# Patient Record
Sex: Male | Born: 1945
Health system: Southern US, Community
[De-identification: ages and names within clinical notes are randomized; demographics above are authoritative.]

## PROBLEM LIST (undated history)

## (undated) DIAGNOSIS — E785 Hyperlipidemia, unspecified: Secondary | ICD-10-CM

## (undated) DIAGNOSIS — I10 Essential (primary) hypertension: Secondary | ICD-10-CM

## (undated) DIAGNOSIS — E119 Type 2 diabetes mellitus without complications: Secondary | ICD-10-CM

## (undated) DIAGNOSIS — Z8249 Family history of ischemic heart disease and other diseases of the circulatory system: Secondary | ICD-10-CM

## (undated) DIAGNOSIS — E669 Obesity, unspecified: Secondary | ICD-10-CM

## (undated) HISTORY — DX: Family history of ischemic heart disease and other diseases of the circulatory system: Z82.49

## (undated) HISTORY — DX: Hyperlipidemia, unspecified: E78.5

## (undated) HISTORY — DX: Obesity, unspecified: E66.9

## (undated) HISTORY — DX: Type 2 diabetes mellitus without complications: E11.9

## (undated) HISTORY — DX: Essential (primary) hypertension: I10

---

## 1999-07-24 HISTORY — PX: DOBUTAMINE STRESS ECHO: SHX5426

## 2001-12-23 HISTORY — PX: NM MYOVIEW LTD: HXRAD82

## 2009-03-09 HISTORY — PX: OTHER SURGICAL HISTORY: SHX169

## 2014-02-22 ENCOUNTER — Ambulatory Visit (INDEPENDENT_AMBULATORY_CARE_PROVIDER_SITE_OTHER): Payer: Medicare HMO | Admitting: Cardiovascular Disease

## 2014-02-22 ENCOUNTER — Encounter: Payer: Self-pay | Admitting: Cardiovascular Disease

## 2014-02-22 DIAGNOSIS — E785 Hyperlipidemia, unspecified: Secondary | ICD-10-CM

## 2014-02-22 DIAGNOSIS — R0609 Other forms of dyspnea: Secondary | ICD-10-CM

## 2014-02-22 DIAGNOSIS — R0600 Dyspnea, unspecified: Secondary | ICD-10-CM

## 2014-02-22 DIAGNOSIS — E119 Type 2 diabetes mellitus without complications: Secondary | ICD-10-CM | POA: Insufficient documentation

## 2014-02-22 DIAGNOSIS — I10 Essential (primary) hypertension: Secondary | ICD-10-CM | POA: Insufficient documentation

## 2014-02-22 DIAGNOSIS — R5383 Other fatigue: Secondary | ICD-10-CM

## 2014-02-22 NOTE — Assessment & Plan Note (Signed)
History of hypertension with blood pressure measured today at 144/78. He is on lisinopril at 45 side. Continue current meds at current dosing

## 2014-02-22 NOTE — Assessment & Plan Note (Signed)
History of hyperlipidemia on Zetia and Crestor with recent blood work performed 02/10/14 by his PCP revealing a total cholesterol 125, LDL 59 HDL of 45.

## 2014-02-22 NOTE — Assessment & Plan Note (Signed)
He complains of dyspnea and fatigue. He does have multiple cardiac risk factors including type 2 diabetes, treated hypertension, hyperlipidemia and family history. I'm going to get an exercise Myoview stress test and a 2-D echocardiogram to evaluate.

## 2014-02-22 NOTE — Patient Instructions (Signed)
  We will see you back in follow up after the tests.   Dr Allyson SabalBerry has ordered: 1. Exercise Myoview- this is a test that looks at the blood flow to your heart muscle.  It takes approximately 2 1/2 hours. Please follow instruction sheet, as given.  2.  Echocardiogram. Echocardiography is a painless test that uses sound waves to create images of your heart. It provides your doctor with information about the size and shape of your heart and how well your heart's chambers and valves are working. This procedure takes approximately one hour. There are no restrictions for this procedure.

## 2014-02-22 NOTE — Progress Notes (Signed)
02/22/2014 Alvin Davis   Jan 17, 1946  [id number]  Primary Physician No primary care provider on file. Primary Cardiologist: Runell Gess MD Roseanne Reno   HPI:  Alvin Davis is a 69 year old mildly overweight married Caucasian male father of 2 children who is retired Nurse, learning disability. He was referred by Dr. Lorin Davis for cardiovascular evaluation because of increasing fatigue and dyspnea. His cardiac risk factor profile is notable for family history with a brother who had bypass grafting at age 66 Alvin Davis , patient of mine). He has treated To diabetes, hypertension and hyperlipidemia. He is never had a heart attack or stroke. He is complaining of increasing fatigue and dyspnea recently.   Current Outpatient Prescriptions  Medication Sig Dispense Refill  . BYDUREON 2 MG SUSR every 7 (seven) days.    . CRESTOR 20 MG tablet 20 mg daily.    . fluticasone (FLONASE) 50 MCG/ACT nasal spray Frequency:as needed   Dosage:50   MCG/ACT  Instructions:Fluticasone Propionate (50MCG/ACT SUSP, Nasal as needed)  Note:    . gabapentin (NEURONTIN) 300 MG capsule Take 300 mg by mouth at bedtime.    Marland Kitchen levothyroxine (SYNTHROID, LEVOTHROID) 125 MCG tablet 125 mcg daily.    Marland Kitchen lisinopril (PRINIVIL,ZESTRIL) 20 MG tablet 20 mg every other day. Alternates with lisinopril hct every other day    . lisinopril-hydrochlorothiazide (PRINZIDE,ZESTORETIC) 20-25 MG per tablet every other day. Alternates qod with plain lisinopril    . metFORMIN (GLUCOPHAGE) 1000 MG tablet 1,000 mg 2 (two) times daily.    . niacin (NIASPAN) 500 MG CR tablet 500 mg daily.    Marland Kitchen omeprazole (PRILOSEC) 40 MG capsule 40 mg as needed.    . pioglitazone (ACTOS) 45 MG tablet 45 mg daily.    Marland Kitchen ZETIA 10 MG tablet 10 mg daily.     No current facility-administered medications for this visit.    No Known Allergies  History   Social History  . Marital Status: Married    Spouse Name: N/A    Number of Children: N/A  . Years of  Education: N/A   Occupational History  . Not on file.   Social History Main Topics  . Smoking status: Never Smoker   . Smokeless tobacco: Not on file  . Alcohol Use: Not on file  . Drug Use: Not on file  . Sexual Activity: Not on file   Other Topics Concern  . Not on file   Social History Narrative  . No narrative on file     Review of Systems: General: negative for chills, fever, night sweats or weight changes.  Cardiovascular: negative for chest pain, dyspnea on exertion, edema, orthopnea, palpitations, paroxysmal nocturnal dyspnea or shortness of breath Dermatological: negative for rash Respiratory: negative for cough or wheezing Urologic: negative for hematuria Abdominal: negative for nausea, vomiting, diarrhea, bright red blood per rectum, melena, or hematemesis Neurologic: negative for visual changes, syncope, or dizziness All other systems reviewed and are otherwise negative except as noted above.    Blood pressure 144/78, pulse 79, height 5' 11.5" (1.816 m), weight 245 lb (111.131 kg).  General appearance: alert and no distress Neck: no adenopathy, no carotid bruit, no JVD, supple, symmetrical, trachea midline and thyroid not enlarged, symmetric, no tenderness/mass/nodules Lungs: clear to auscultation bilaterally Heart: regular rate and rhythm, S1, S2 normal, no murmur, click, rub or gallop Extremities: extremities normal, atraumatic, no cyanosis or edema and 2+ pedal pulses bilaterally  EKG normal sinus rhythm at 79 without ST or T-wave  changes. I personally reviewed this EKG  ASSESSMENT AND PLAN:   Hyperlipidemia History of hyperlipidemia on Zetia and Crestor with recent blood work performed 02/10/14 by his PCP revealing a total cholesterol 125, LDL 59 HDL of 45.   Essential hypertension History of hypertension with blood pressure measured today at 144/78. He is on lisinopril at 45 side. Continue current meds at current dosing   Dyspnea on exertion He  complains of dyspnea and fatigue. He does have multiple cardiac risk factors including type 2 diabetes, treated hypertension, hyperlipidemia and family history. I'm going to get an exercise Myoview stress test and a 2-D echocardiogram to evaluate.       Runell GessJonathan J. Lamark Schue MD FACP,FACC,FAHA, [facility] 02/22/2014 5:28 PM

## 2014-03-16 ENCOUNTER — Encounter (HOSPITAL_COMMUNITY): Payer: Medicare HMO

## 2014-03-16 ENCOUNTER — Ambulatory Visit (HOSPITAL_COMMUNITY): Payer: Medicare HMO

## 2014-03-29 ENCOUNTER — Telehealth (HOSPITAL_COMMUNITY): Payer: Self-pay

## 2014-03-29 NOTE — Telephone Encounter (Signed)
Encounter complete. 

## 2014-03-31 ENCOUNTER — Ambulatory Visit (HOSPITAL_BASED_OUTPATIENT_CLINIC_OR_DEPARTMENT_OTHER)
Admission: RE | Admit: 2014-03-31 | Discharge: 2014-03-31 | Disposition: A | Discharge status: Home / Self Care | Payer: Medicare HMO | Source: Ambulatory Visit | Attending: Cardiovascular Disease | Admitting: Cardiovascular Disease

## 2014-03-31 ENCOUNTER — Ambulatory Visit (HOSPITAL_COMMUNITY)
Admission: RE | Admit: 2014-03-31 | Discharge: 2014-03-31 | Disposition: A | Discharge status: Home / Self Care | Payer: Medicare HMO | Source: Ambulatory Visit | Attending: Cardiology | Admitting: Cardiology

## 2014-03-31 DIAGNOSIS — E785 Hyperlipidemia, unspecified: Secondary | ICD-10-CM | POA: Diagnosis not present

## 2014-03-31 DIAGNOSIS — Z8249 Family history of ischemic heart disease and other diseases of the circulatory system: Secondary | ICD-10-CM | POA: Insufficient documentation

## 2014-03-31 DIAGNOSIS — I10 Essential (primary) hypertension: Secondary | ICD-10-CM | POA: Insufficient documentation

## 2014-03-31 DIAGNOSIS — E669 Obesity, unspecified: Secondary | ICD-10-CM | POA: Insufficient documentation

## 2014-03-31 DIAGNOSIS — R0600 Dyspnea, unspecified: Secondary | ICD-10-CM

## 2014-03-31 DIAGNOSIS — R0609 Other forms of dyspnea: Secondary | ICD-10-CM | POA: Diagnosis not present

## 2014-03-31 DIAGNOSIS — E119 Type 2 diabetes mellitus without complications: Secondary | ICD-10-CM | POA: Insufficient documentation

## 2014-03-31 DIAGNOSIS — R5383 Other fatigue: Secondary | ICD-10-CM

## 2014-03-31 DIAGNOSIS — R42 Dizziness and giddiness: Secondary | ICD-10-CM | POA: Insufficient documentation

## 2014-03-31 MED ORDER — TECHNETIUM TC 99M SESTAMIBI GENERIC - CARDIOLITE
10.8000 | Freq: Once | INTRAVENOUS | Status: AC | PRN
  Administered 2014-03-31: 11 via INTRAVENOUS

## 2014-03-31 MED ORDER — TECHNETIUM TC 99M SESTAMIBI GENERIC - CARDIOLITE
32.0000 | Freq: Once | INTRAVENOUS | Status: AC | PRN
  Administered 2014-03-31: 32 via INTRAVENOUS

## 2014-03-31 MED ORDER — PERFLUTREN LIPID MICROSPHERE
1.0000 mL | INTRAVENOUS | Status: AC | PRN
  Administered 2014-03-31: 2.5 mL via INTRAVENOUS

## 2014-03-31 NOTE — Progress Notes (Addendum)
2D Echo Performed 03/31/2014    Clearence Pedammie Alvin Davis, RCS  Definity used BP before 144/85, BP after 131/89

## 2014-03-31 NOTE — Procedures (Addendum)
[facility] [address] [place] [id number] [phone]  Cardiology Nuclear Med Study  Toney ReilWilliam Davis is a 69 y.o. male     MRN : [id number]     DOB: [birth year]  Procedure Date: 03/31/2014  Nuclear Med Background Indication for Stress Test:  Evaluation for Ischemia History:  No prior cardiac or respiratory history reported;No prior NUC MPI for comparison. Cardiac Risk Factors: Family History - CAD, Hypertension, Lipids, NIDDM and Obesity  Symptoms:  Dizziness, DOE, Fatigue and Light-Headedness   Nuclear Pre-Procedure Caffeine/Decaff Intake:  9:00pm NPO After: 5:00am   IV Site: R Wrist  IV 0.9% NS with Angio Cath:  22g  Chest Size (in):  50" IV Started by: Berdie OgrenAmanda Wease, RN  Height: 5\' 11"  (1.803 m)  Cup Size: n/a  BMI:  Body mass index is 34.19 kg/(m^2). Weight:  245 lb (111.131 kg)   Tech Comments:  n/a    Nuclear Med Study 1 or 2 day study: 1 day  Stress Test Type:  Stress  Order Authorizing Provider:  Nanetta BattyJonathan Berry, MD   Resting Radionuclide: Technetium 6998m Sestamibi  Resting Radionuclide Dose: 10.8 mCi   Stress Radionuclide:  Technetium 1798m Sestamibi  Stress Radionuclide Dose: 32.0 mCi           Stress Protocol Rest HR: 88 Max HR:  151  Rest BP: 149/98 Stress BP: 126/89  Exercise Time (min): 5 METS: 7.0   Predicted Max HR: 154 bpm % Max HR: 98.05 bpm Rate Pressure Product: [id number]  Dose of Adenosine (mg):  n/a Dose of Lexiscan: n/a mg  Dose of Atropine (mg): n/a Dose of Dobutamine: n/a mcg/kg/min (at max HR)  Stress Test Technologist: Alvin Davis, CCT Nuclear Technologist: Alvin Davis, CNMT   Rest Procedure:  Myocardial perfusion imaging was performed at rest 45 minutes following the intravenous administration of Technetium 2898m Sestamibi. Stress Procedure:  The patient performed treadmill exercise using a Bruce  Protocol for 5 minutes. The patient stopped due to Drop in B/P, SOB, Leg Fatigue and  General Fatigue and denied any chest pain.  There were no significant ST-T wave changes.  Technetium 6998m Sestamibi was injected IV at peak exercise and myocardial perfusion imaging was performed after a brief delay.  Transient Ischemic Dilatation (Normal <1.22):  0.92  QGS EDV:  73 ml QGS ESV:  25 ml LV Ejection Fraction: 66%    Rest ECG: NSR with non-specific ST-T wave changes  Stress ECG: No significant ST segment change suggestive of ischemia.  QPS Raw Data Images:  Acquisition technically good; normal left ventricular size. Stress Images:  Normal homogeneous uptake in all areas of the myocardium. Rest Images:  Normal homogeneous uptake in all areas of the myocardium. Subtraction (SDS):  No evidence of ischemia.  Impression Exercise Capacity:  Fair exercise capacity. BP Response:  Hypotensive blood pressure response. Clinical Symptoms:  There is dyspnea. ECG Impression:  No significant ST segment change suggestive of ischemia. Comparison with Prior Nuclear Study: No previous nuclear study performed  Overall Impression:  Low risk stress nuclear study with no ST changes and normal perfusion; SBP documented to decrease from stage 1 (146) to stage 2 (126).  LV Wall Motion:  NL LV Function; NL Wall Motion   Alvin MillersBrian Alyiah Ulloa, MD  03/31/2014 5:22 PM

## 2014-04-14 ENCOUNTER — Telehealth: Payer: Self-pay | Admitting: Cardiovascular Disease

## 2014-04-14 NOTE — Telephone Encounter (Signed)
Close encounter 

## 2014-04-19 ENCOUNTER — Ambulatory Visit: Payer: Medicare HMO | Admitting: Cardiovascular Disease

## 2014-05-18 ENCOUNTER — Ambulatory Visit (INDEPENDENT_AMBULATORY_CARE_PROVIDER_SITE_OTHER): Payer: Medicare PPO | Admitting: Cardiovascular Disease

## 2014-05-18 ENCOUNTER — Encounter: Payer: Self-pay | Admitting: Cardiovascular Disease

## 2014-05-18 VITALS — BP 136/72 | HR 92 | Ht 71.5 in | Wt 244.7 lb

## 2014-05-18 DIAGNOSIS — R0609 Other forms of dyspnea: Secondary | ICD-10-CM

## 2014-05-18 NOTE — Progress Notes (Signed)
Mr. Alvin Davis returns today for follow-up of his outpatient noninvasive diagnostic tests performed on 03/31/14 for fatigue and dyspnea.his 2-D echo was normal as was his Myoview stress test. I've reviewed the results with him and their implications. I reassured him and we'll see him back in one year.  [name], [name], FACP, [facility]ACC, Earl LagosFAHA, [facility] [address] [place], [place]  [id number]  [phone] 05/18/2014 4:48 PM

## 2014-05-18 NOTE — Patient Instructions (Signed)
Dr Berry recommends that you schedule a follow-up appointment in 1 year. You will receive a reminder letter in the mail two months in advance. If you don't receive a letter, please call our office to schedule the follow-up appointment. 

## 2014-05-18 NOTE — Assessment & Plan Note (Signed)
Alvin Davis returns today for follow-up of his outpatient noninvasive diagnostic tests including 2-D echo and Myoview stress test all of which were normal. I reassured him that the likelihood of him having significant CAD is small but not 0 especially given his propensity of his risk factors. I will see him back in one year

## 2014-09-16 DIAGNOSIS — E782 Mixed hyperlipidemia: Secondary | ICD-10-CM | POA: Diagnosis not present

## 2014-09-16 DIAGNOSIS — I10 Essential (primary) hypertension: Secondary | ICD-10-CM | POA: Diagnosis not present

## 2014-09-16 DIAGNOSIS — E119 Type 2 diabetes mellitus without complications: Secondary | ICD-10-CM | POA: Diagnosis not present

## 2014-11-09 DIAGNOSIS — E1121 Type 2 diabetes mellitus with diabetic nephropathy: Secondary | ICD-10-CM | POA: Diagnosis not present

## 2014-11-16 DIAGNOSIS — Z9181 History of falling: Secondary | ICD-10-CM | POA: Diagnosis not present

## 2014-11-16 DIAGNOSIS — E1121 Type 2 diabetes mellitus with diabetic nephropathy: Secondary | ICD-10-CM | POA: Diagnosis not present

## 2014-11-16 DIAGNOSIS — L409 Psoriasis, unspecified: Secondary | ICD-10-CM | POA: Diagnosis not present

## 2014-11-16 DIAGNOSIS — R2689 Other abnormalities of gait and mobility: Secondary | ICD-10-CM | POA: Diagnosis not present

## 2016-03-08 ENCOUNTER — Telehealth: Payer: Self-pay | Admitting: Cardiovascular Disease

## 2016-03-08 NOTE — Telephone Encounter (Signed)
Patient calling Willaim ShengBillie back about scheduling return OV (overdue 1 yr f/u w Dr. Allyson SabalBerry.) Aware she is out of office, can call patient back next week. Pt states this is fine.  Please call - thanks.

## 2016-03-08 NOTE — Telephone Encounter (Signed)
New Message ° °Pt call requesting to speak with RN about scheduling a stress test. Please call back to discuss ° °

## 2016-03-08 NOTE — Telephone Encounter (Signed)
Closed encounter °

## 2016-03-18 ENCOUNTER — Ambulatory Visit (INDEPENDENT_AMBULATORY_CARE_PROVIDER_SITE_OTHER): Payer: Medicare Other | Admitting: Physician Assistant

## 2016-03-18 ENCOUNTER — Encounter: Payer: Self-pay | Admitting: Physician Assistant

## 2016-03-18 VITALS — BP 140/86 | HR 99 | Ht 71.5 in | Wt 228.0 lb

## 2016-03-18 DIAGNOSIS — E118 Type 2 diabetes mellitus with unspecified complications: Secondary | ICD-10-CM | POA: Diagnosis not present

## 2016-03-18 DIAGNOSIS — E785 Hyperlipidemia, unspecified: Secondary | ICD-10-CM

## 2016-03-18 DIAGNOSIS — R55 Syncope and collapse: Secondary | ICD-10-CM | POA: Diagnosis not present

## 2016-03-18 DIAGNOSIS — I10 Essential (primary) hypertension: Secondary | ICD-10-CM

## 2016-03-18 MED ORDER — AMLODIPINE BESYLATE 5 MG PO TABS: 5.0000 mg | ORAL_TABLET | Freq: Every day | ORAL | 6 refills | Status: DC

## 2016-03-18 NOTE — Progress Notes (Signed)
Cardiology Office Note    Date:  03/18/2016   ID:  SHEDRICK SARLI, DOB 1945-02-20, MRN [id number]  PCP:  Lucila Maine, MD  Cardiologist:  Dr. Allyson Sabal  Chief Complaint  Patient presents with  . Follow-up    seen for Dr. Allyson Sabal, pt passed out during exercise class and passed out    History of Present Illness:  Alvin Davis is a 71 y.o. male with PMH of HTN, HLD, type II DM and obesity. He is a retired Nurse, learning disability. He was referred by his PCP to Dr. Allyson Sabal on 02/22/2014 for cardiovascular evaluation. He was complaining of fatigue and dyspnea at that time. Prior to that, he had a negative myocardial perfusion stress test in 2003. He also had a negative ETT in February 2011. Given his new fatigue and dyspnea, a repeat stress test and echocardiogram was ordered. Echocardiogram obtained on 03/31/2014 showed EF 60-65%, grade 1 diastolic dysfunction, no regional wall motion abnormality. Myoview obtained on the same day was low risk with no ST changes and normal perfusion. Patent return for follow-up on 05/18/2014 and was reassured based on the exam results. One-year follow-up was recommended.  He presents today for cardiology office evaluation. Apparently 2 weeks ago he had a syncopal episode at [facility]. He says he has been trying to get back to exercise in the recent weeks. He has a long-standing history of gait and imbalance issues for the past 2 years. He was doing activity at [facility] that include sitting down and standing up when he had dizziness and lightheadedness. He said down and rested a minute and as soon as he stood up, he passed out. He doesn't remember the falling down. He woke up a few minutes later on the floor. He denies any prodromal symptom test chest pain, shortness of breath, palpitation, after he woke up he denies any symptom afterward as well. He denies any nausea or vomiting. The only symptom he felt on that day was lightheadedness prior to the episode. He was taken to [facility], he reportedly had a head CT over there that was negative for acute process however does show possible hydrocephalus. He is being referred to a neurosurgeon for further evaluation. He is also wearing 2 weeks event monitor that was put on by his PCP. As far as his medications, he is no longer on lisinopril HCTZ, he is on lisinopril 20 mg daily. His blood pressure today is 140/86. We did obtain orthostatic vital sign, see below.   Lying BP 139/84 HR 86 Sitting BP 141/89 HR 90 Standing 131/81 HR 94 Standing at the three-minute 140/86 HR 99  Despite increasing his heart rate, and there was no significant drop of the blood pressure. He says he was exerting way out of ordinary on that day and was concerned he was dehydrating himself. That was one of the reason he stopped the hydrochlorothiazide. His blood pressure is mildly elevated today, I will add a 5 mg amlodipine to his medical regimen. I recommended to hold off on ischemic workup since he is not having any exertional chest discomfort. He can follow-up with Dr. Allyson Sabal in 3 months for reevaluation of symptoms. As far as his possible hydrocephalus, I doubt HCTZ significantly improve his symptom, therefore I did not feel strongly about restarting the HCTZ. There is some indication that acetazolamide can potentially help, however further evaluation will need to be done prior to confirming the diagnosis.   Past Medical History:  Diagnosis Date  .  Family history of heart disease   . Hyperlipidemia   . Hypertension   . Obesity   . Type 2 diabetes mellitus (HCC)     Past Surgical History:  Procedure Laterality Date  . DOBUTAMINE STRESS ECHO  07/24/1999  . GXT  03/09/2009  . NM MYOVIEW LTD  12/23/2001   EF 78%    Current Medications: Outpatient Medications Prior to Visit  Medication Sig Dispense Refill  . CIALIS 20 MG tablet Take 1 tablet by mouth as needed.    . CRESTOR 20 MG tablet 20 mg daily.    . fluticasone (FLONASE) 50 MCG/ACT  nasal spray Frequency:as needed   Dosage:50   MCG/ACT  Instructions:Fluticasone Propionate (50MCG/ACT SUSP, Nasal as needed)  Note:    . gabapentin (NEURONTIN) 300 MG capsule Take 300 mg by mouth at bedtime.    Marland Kitchen. levothyroxine (SYNTHROID, LEVOTHROID) 125 MCG tablet 125 mcg daily.    Marland Kitchen. lisinopril (PRINIVIL,ZESTRIL) 20 MG tablet Take 20 mg by mouth daily.     . metFORMIN (GLUCOPHAGE) 1000 MG tablet 1,000 mg 2 (two) times daily.    Marland Kitchen. omeprazole (PRILOSEC) 40 MG capsule 40 mg as needed.    . pioglitazone (ACTOS) 45 MG tablet 45 mg daily.    . TRULICITY 0.75 MG/0.5ML SOPN Inject into the skin once a week.    Marland Kitchen. ZETIA 10 MG tablet 10 mg daily.    Marland Kitchen. zolpidem (AMBIEN) 10 MG tablet Take 1 tablet by mouth as needed.    Marland Kitchen. lisinopril-hydrochlorothiazide (PRINZIDE,ZESTORETIC) 20-25 MG per tablet every other day. Alternates qod with plain lisinopril    . niacin (NIASPAN) 500 MG CR tablet 500 mg daily.     No facility-administered medications prior to visit.      Allergies:   Patient has no known allergies.   Social History   Social History  . Marital status: Married    Spouse name: N/A  . Number of children: N/A  . Years of education: N/A   Social History Main Topics  . Smoking status: Never Smoker  . Smokeless tobacco: Never Used  . Alcohol use None  . Drug use: Unknown  . Sexual activity: Not Asked   Other Topics Concern  . None   Social History Narrative  . None     Family History:  The patient's family history includes Heart Problems in his father; Heart disease in his brother.   ROS:   Please see the history of present illness.    ROS All other systems reviewed and are negative.   PHYSICAL EXAM:   VS:  BP 140/86 (BP Location: Right Arm, Cuff Size: Normal)   Pulse 99   Ht 5' 11.5" (1.816 m)   Wt 228 lb (103.4 kg)   BMI 31.36 kg/m    GEN: Well nourished, well developed, in no acute distress  HEENT: normal  Neck: no JVD, carotid bruits, or masses Cardiac: RRR; no murmurs,  rubs, or gallops,no edema  Respiratory:  clear to auscultation bilaterally, normal work of breathing GI: soft, nontender, nondistended, + BS MS: no deformity or atrophy  Skin: warm and dry, no rash Neuro:  Alert and Oriented x 3, Strength and sensation are intact Psych: euthymic mood, full affect  Wt Readings from Last 3 Encounters:  03/18/16 228 lb (103.4 kg)  05/18/14 244 lb 11.2 oz (111 kg)  03/31/14 245 lb (111.1 kg)      Studies/Labs Reviewed:   EKG:  EKG is ordered today.  The ekg ordered today demonstrates  Normal sinus rhythm without significant ST-T wave changes  Recent Labs: No results found for requested labs within last 8760 hours.   Lipid Panel No results found for: CHOL, TRIG, HDL, CHOLHDL, VLDL, LDLCALC, LDLDIRECT  Additional studies/ records that were reviewed today include:   Echo 03/31/2014 LV EF: 60% -  65%  - Left ventricle: The cavity size was normal. There was moderate concentric hypertrophy. Systolic function was normal. The estimated ejection fraction was in the range of 60% to 65%. Wall motion was normal; there were no regional wall motion abnormalities. Doppler parameters are consistent with abnormal left ventricular relaxation (grade 1 diastolic dysfunction). The E/e&' ratio is between 8-15, suggesting indeterminate LV filling pressure. - Left atrium: The atrium was normal in size.  Impressions:  - LVEF 60-65%, moderate concentric LVH, normal wall motion, diastolic dysfunction, indeterminate LV filling pressure, normal LA size.   Myoview 03/31/2014 Impression Exercise Capacity:  Fair exercise capacity. BP Response:  Hypotensive blood pressure response. Clinical Symptoms:  There is dyspnea. ECG Impression:  No significant ST segment change suggestive of ischemia. Comparison with Prior Nuclear Study: No previous nuclear study performed  Overall Impression:  Low risk stress nuclear study with no ST changes and normal  perfusion; SBP documented to decrease from stage 1 (146) to stage 2 (126).  LV Wall Motion:  NL LV Function; NL Wall Motion   ASSESSMENT:    1. Syncope, unspecified syncope type   2. Essential hypertension   3. Hyperlipidemia, unspecified hyperlipidemia type   4. Controlled type 2 diabetes mellitus with complication, without long-term current use of insulin (HCC)      PLAN:  In order of problems listed above:  1. Syncope: Other than some dizziness prior to the event, he really did not have any other symptom. Head CT obtained at [facility] was concerning for possible hydrocephalus, he is being referred to a neurosurgeon for additional workup. We plan to request record from [facility]. Since he did not experience any chest discomfort, I would not recommend any ischemic workup at this time unless he has any recurrence. He is currently wearing a 2 week event monitor, we will request a record from his PCPs office in 2 weeks to follow-up on the result.  2. Hypertension: He is no longer on lisinopril/HCTZ, he was concerned about dehydration. He is on lisinopril 20 mg daily. I will add nothing 5 mg daily to help with his blood pressure. I did not feel strongly about restarting HCTZ as I did not see clear evidence that they will help with possible hydrocephalus. Although there is some mention that acetazolamide may help. Again this is not a confirmed diagnosis, but what is suggested based on the CT of the head and he is going to see a neurosurgeon before confirming the diagnosis.  3. Hyperlipidemia: Unfortunately, he has also discontinue Zetia at this time.  4. DM 2: On Actos and Trulicity    Medication Adjustments/Labs and Tests Ordered: Current medicines are reviewed at length with the patient today.  Concerns regarding medicines are outlined above.  Medication changes, Labs and Tests ordered today are listed in the Patient Instructions below. Patient Instructions  Medication  Instructions:  START amlodipine 5mg  (1 tablet) one time daily.  Labwork: NONE  Testing/Procedures: NONE-we will request records from PCP regarding 2 week event monitor.   Follow-Up: Your physician recommends that you schedule a follow-up appointment in: 2-3 Months with Dr. Allyson Sabal.   Any Other Special Instructions Will Be Listed Below (If  Applicable).     If you need a refill on your cardiac medications before your next appointment, please call your pharmacy.      Ramond Dial, [place]  03/18/2016 4:53 PM    [facility] [address] [place], [place], [place]  [address] Phone: [phone]; Fax: [phone]

## 2016-03-18 NOTE — Patient Instructions (Signed)
Medication Instructions:  START amlodipine 5mg  (1 tablet) one time daily.  Labwork: NONE  Testing/Procedures: NONE-we will request records from PCP regarding 2 week event monitor.   Follow-Up: Your physician recommends that you schedule a follow-up appointment in: 2-3 Months with Dr. Allyson SabalBerry.   Any Other Special Instructions Will Be Listed Below (If Applicable).     If you need a refill on your cardiac medications before your next appointment, please call your pharmacy.

## 2016-03-19 ENCOUNTER — Telehealth: Payer: Self-pay | Admitting: Physician Assistant

## 2016-03-19 NOTE — Telephone Encounter (Signed)
[facility] requested by Azalee CourseHao Meng PA  [facility]

## 2016-04-12 ENCOUNTER — Telehealth: Payer: Self-pay | Admitting: Cardiovascular Disease

## 2016-04-12 NOTE — Telephone Encounter (Signed)
Closed encounter °

## 2016-04-25 ENCOUNTER — Telehealth: Payer: Self-pay | Admitting: Cardiovascular Disease

## 2016-04-25 NOTE — Telephone Encounter (Signed)
[facility] appointment on 06/25/16 with Dr Allyson SabalBerry.  Records put with Dr Hazle CocaBerry's schedule on 06/25/16. lp

## 2016-04-26 ENCOUNTER — Telehealth: Payer: Self-pay | Admitting: Cardiovascular Disease

## 2016-04-26 NOTE — Telephone Encounter (Signed)
Returned call to patient-left detailed message (ok per DPR).  Advised Dr. Allyson Sabal nurse has reached out to Dr. Allyson Sabal in regards to sooner appointment.  Advised that we would contact once we are advised if sooner appointment is needed.  (see result note)  Advised to return call with any further questions or concerns.

## 2016-04-26 NOTE — Telephone Encounter (Signed)
Patient was supposed to have an Event Monitor f/u visit but states that he had a stent installed on Monday, 04-22-16. Patient is wondering if he needs to come in to be seen. Please call, thanks.

## 2016-05-02 NOTE — Telephone Encounter (Signed)
Message sent to scheduling to have pt seen at the end of April. Currently scheduled 06/25/16.

## 2016-05-08 ENCOUNTER — Encounter: Payer: Self-pay | Admitting: Cardiovascular Disease

## 2016-05-09 ENCOUNTER — Encounter: Payer: Self-pay | Admitting: Cardiovascular Disease

## 2016-05-13 NOTE — Telephone Encounter (Signed)
Pt scheduled for 05/22/16 with Dr. Allyson Sabal.

## 2016-05-21 ENCOUNTER — Telehealth: Payer: Self-pay | Admitting: Cardiovascular Disease

## 2016-05-21 NOTE — Telephone Encounter (Signed)
Returned call to patient. Apologized that I was unable to locate in EPIC documentation who may have tried to reach out to him. He states he has had a lot of stuff done at [facility] so he was unsure if it was in reference to that. Advised that his medical concerns/questions will be discussed tomorrow at his appointment w/Dr. Allyson Sabal. He voiced understanding.

## 2016-05-21 NOTE — Telephone Encounter (Signed)
New message     Pt returning someones calls about some medical questions, please call back leave message with your name and what it is regarding , he is going into court

## 2016-05-22 ENCOUNTER — Encounter: Payer: Self-pay | Admitting: Cardiovascular Disease

## 2016-05-22 ENCOUNTER — Ambulatory Visit (INDEPENDENT_AMBULATORY_CARE_PROVIDER_SITE_OTHER): Payer: Medicare Other | Admitting: Cardiovascular Disease

## 2016-05-22 DIAGNOSIS — I10 Essential (primary) hypertension: Secondary | ICD-10-CM

## 2016-05-22 DIAGNOSIS — I472 Ventricular tachycardia: Secondary | ICD-10-CM | POA: Insufficient documentation

## 2016-05-22 DIAGNOSIS — E7800 Pure hypercholesterolemia, unspecified: Secondary | ICD-10-CM

## 2016-05-22 DIAGNOSIS — I2510 Atherosclerotic heart disease of native coronary artery without angina pectoris: Secondary | ICD-10-CM | POA: Diagnosis not present

## 2016-05-22 DIAGNOSIS — I4729 Other ventricular tachycardia: Secondary | ICD-10-CM

## 2016-05-22 DIAGNOSIS — Z9861 Coronary angioplasty status: Secondary | ICD-10-CM

## 2016-05-22 NOTE — Progress Notes (Signed)
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 He was referred by Dr. Lorin Picket for cardiovascular evaluation because of increasing fatigue and dyspnea. I last saw him in the office 05/18/14. His cardiac risk factor profile is notable for family history with a brother who had bypass grafting at age 37 Alvin Davis , patient of mine). He has treated To diabetes, hypertension and hyperlipidemia. He is never had a heart attack or stroke. He had been complaining of increasing fatigue and dyspnea . I performed 2-D echocardiography and Myoview stress testing back then which were all normal. He has since been obstructive hydrocephalus. He had an episode of syncope and had an event monitor that showed an episode of nonsustained ventricular tachycardia. He was referred to neurosurgeon at [facility] to subtotally referred him to an elective physiologist and ultimately an interventional cardiologist, Dr. Huel Coventry, who performed cardiac catheterization on him 20/29/18 radially revealing a 80 % mid LAD lesion which was stented using Medtronic Onyx drug-eluting stents. He is currently on aspirin and Effient which cannot be stopped for at least 6-12 months..   Current Outpatient Prescriptions  Medication Sig Dispense Refill  . amLODipine (NORVASC) 5 MG tablet Take 1 tablet (5 mg total) by mouth daily. 30 tablet 6  . carbidopa-levodopa (SINEMET IR) 25-100 MG tablet Take 1 tablet by mouth 3 (three) times daily.    Marland Kitchen CIALIS 20 MG tablet Take 1 tablet by mouth as needed.    . CRESTOR 20 MG tablet 20 mg daily.    . fluticasone (FLONASE) 50 MCG/ACT nasal spray Frequency:as needed   Dosage:50   MCG/ACT  Instructions:Fluticasone Propionate (50MCG/ACT SUSP, Nasal as needed)   Note:    . gabapentin (NEURONTIN) 300 MG capsule Take 300 mg by mouth at bedtime.    Marland Kitchen levothyroxine (SYNTHROID, LEVOTHROID) 125 MCG tablet 125 mcg daily.    Marland Kitchen lisinopril (PRINIVIL,ZESTRIL) 20 MG tablet Take 20 mg by mouth daily.     . metFORMIN (GLUCOPHAGE) 1000 MG tablet 1,000 mg 2 (two) times daily.    . metoprolol succinate (TOPROL-XL) 25 MG 24 hr tablet Take 25 mg by mouth daily.    Marland Kitchen omeprazole (PRILOSEC) 40 MG capsule 40 mg as needed.    . pioglitazone (ACTOS) 45 MG tablet 45 mg daily.    . TRULICITY 0.75 MG/0.5ML SOPN Inject into the skin once a week.    Marland Kitchen ZETIA 10 MG tablet 10 mg daily.    Marland Kitchen zolpidem (AMBIEN) 10 MG tablet Take 1 tablet by mouth as needed.     No current facility-administered medications for this visit.     Allergies  Allergen Reactions  . Ivp Dye [Iodinated Diagnostic Agents] Rash    Social History   Social History  . Marital status: Married    Spouse name: N/A  . Number of children: N/A  . Years of education: N/A   Occupational History  . Not on file.   Social History Main Topics  . Smoking status: Never Smoker  . Smokeless tobacco: Never Used  . Alcohol use Not on file  . Drug use: Unknown  . Sexual activity: Not on file   Other Topics Concern  . Not on file   Social History Narrative  . No narrative  on file     Review of Systems: General: negative for chills, fever, night sweats or weight changes.  Cardiovascular: negative for chest pain, dyspnea on exertion, edema, orthopnea, palpitations, paroxysmal nocturnal dyspnea or shortness of breath Dermatological: negative for rash Respiratory: negative for cough or wheezing Urologic: negative for hematuria Abdominal: negative for nausea, vomiting, diarrhea, bright red blood per rectum, melena, or hematemesis Neurologic: negative for visual changes, syncope, or dizziness All other systems reviewed and are otherwise negative except as noted above.    Blood pressure 128/76, pulse 70, height   (1.803 m), weight 232 lb (105.2 kg).  General appearance: alert and no distress Neck: no adenopathy, no carotid bruit, no JVD, supple, symmetrical, trachea midline and thyroid not enlarged, symmetric, no tenderness/mass/nodules Lungs: clear to auscultation bilaterally Heart: regular rate and rhythm, S1, S2 normal, no murmur, click, rub or gallop Extremities: extremities normal, atraumatic, no cyanosis or edema  EKG not performed today  ASSESSMENT AND PLAN:   Essential hypertension History of hypertension blood pressure measured today 120/76. He is on amlodipine and lisinopril. Continue current meds at current dosing  Hyperlipidemia History of hyperlipidemia on statin therapy and Zetia followed by his PCP  Coronary artery disease History of CAD status post recent stenting at [facility] 04/25/16 by Dr. Huel Coventry. He had 80% mid LAD stenosis and had Medtronic Onyx stents placed. He had normal LV function. This was performed radially. This was done in evaluation of nonsustained ventricular tachycardia prior to anticipated DP shunting for hydrocephalus. He had no angina prior to that noted in the stress test. He is on full dose aspirin and Effient.  Nonsustained ventricular tachycardia (HCC) History of nonsustained ventricular tachycardia, one episode, found on event monitoring performed because of syncope.      Runell Gess MD FACP,FACC,FAHA, [facility] 05/22/2016 11:14 AM

## 2016-05-22 NOTE — Assessment & Plan Note (Signed)
History of hypertension blood pressure measured today 120/76. He is on amlodipine and lisinopril. Continue current meds at current dosing

## 2016-05-22 NOTE — Assessment & Plan Note (Signed)
History of hyperlipidemia on statin therapy and Zetia followed by his PCP 

## 2016-05-22 NOTE — Assessment & Plan Note (Signed)
History of CAD status post recent stenting at [facility] 04/25/16 by Dr. Huel Coventry. He had 80% mid LAD stenosis and had Medtronic Onyx stents placed. He had normal LV function. This was performed radially. This was done in evaluation of nonsustained ventricular tachycardia prior to anticipated DP shunting for hydrocephalus. He had no angina prior to that noted in the stress test. He is on full dose aspirin and Effient.

## 2016-05-22 NOTE — Assessment & Plan Note (Signed)
History of nonsustained ventricular tachycardia, one episode, found on event monitoring performed because of syncope.

## 2016-05-22 NOTE — Patient Instructions (Signed)
Medication Instructions: Your physician recommends that you continue on your current medications as directed. Please refer to the Current Medication list given to you today.  Labwork: I will request recent lab work from Dr. Lorin Picket.   Follow-Up: Your physician wants you to follow-up in: 1 year with Dr. Allyson Sabal. You will receive a reminder letter in the mail two months in advance. If you don't receive a letter, please call our office to schedule the follow-up appointment.  If you need a refill on your cardiac medications before your next appointment, please call your pharmacy.

## 2016-06-25 ENCOUNTER — Ambulatory Visit: Payer: Medicare Other | Admitting: Cardiovascular Disease

## 2016-06-25 ENCOUNTER — Encounter: Payer: Self-pay | Admitting: Cardiovascular Disease

## 2016-10-15 ENCOUNTER — Other Ambulatory Visit: Payer: Self-pay

## 2016-10-15 MED ORDER — AMLODIPINE BESYLATE 5 MG PO TABS
5.0000 mg | ORAL_TABLET | Freq: Every day | ORAL | 11 refills | Status: DC
Start: 2016-10-15 — End: 2017-11-10

## 2017-10-03 ENCOUNTER — Ambulatory Visit: Payer: Medicare Other | Admitting: Cardiology

## 2017-10-03 DIAGNOSIS — I2510 Atherosclerotic heart disease of native coronary artery without angina pectoris: Secondary | ICD-10-CM | POA: Diagnosis not present

## 2017-10-03 DIAGNOSIS — G912 (Idiopathic) normal pressure hydrocephalus: Secondary | ICD-10-CM | POA: Diagnosis not present

## 2017-10-03 DIAGNOSIS — Z9861 Coronary angioplasty status: Secondary | ICD-10-CM | POA: Diagnosis not present

## 2017-10-03 DIAGNOSIS — E785 Hyperlipidemia, unspecified: Secondary | ICD-10-CM

## 2017-10-03 DIAGNOSIS — E119 Type 2 diabetes mellitus without complications: Secondary | ICD-10-CM

## 2017-10-03 DIAGNOSIS — I4729 Other ventricular tachycardia: Secondary | ICD-10-CM

## 2017-10-03 DIAGNOSIS — S065X9A Traumatic subdural hemorrhage with loss of consciousness of unspecified duration, initial encounter: Secondary | ICD-10-CM

## 2017-10-03 DIAGNOSIS — I10 Essential (primary) hypertension: Secondary | ICD-10-CM

## 2017-10-03 DIAGNOSIS — I472 Ventricular tachycardia: Secondary | ICD-10-CM

## 2017-10-03 DIAGNOSIS — S065XAA Traumatic subdural hemorrhage with loss of consciousness status unknown, initial encounter: Secondary | ICD-10-CM

## 2017-10-03 MED ORDER — METOPROLOL SUCCINATE ER 25 MG PO TB24: 25.0000 mg | ORAL_TABLET | Freq: Every day | ORAL | 2 refills | Status: DC

## 2017-10-03 NOTE — Assessment & Plan Note (Signed)
Followed by Dr. Scott 

## 2017-10-03 NOTE — Assessment & Plan Note (Signed)
LAD PCI with DES May 2018 at [facility] after work up for syncope and NSVT

## 2017-10-03 NOTE — Assessment & Plan Note (Signed)
On Glucopahge 

## 2017-10-03 NOTE — Patient Instructions (Signed)
Medication Instructions:  Your physician recommends that you continue on your current medications as directed. Please refer to the Current Medication list given to you today.  If you need a refill on your cardiac medications before your next appointment, please call your pharmacy.  Labwork: None  [employer] in our office  Testing/Procedures: None   Follow-Up: Your physician wants you to follow-up in: 12 MONTH WITH DR BERRY. You will receive a reminder letter in the mail two months in advance. If you don't receive a letter, please call our office to schedule the follow-up appointment.  Any Other Special Instructions Will Be Listed Below (If Applicable).

## 2017-10-03 NOTE — Progress Notes (Signed)
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 He was referred to Dr Alvin Davis by Dr. Lorin Davis in 2016 for cardiovascular evaluation because of increasing fatigue and dyspnea. His cardiac risk factor profile is notable for family history with a brother who had bypass grafting at age 56 Alvin Davis , patient of Dr Alvin Davis). [name]. Knack has treated To diabetes, hypertension and hyperlipidemia.Marland Kitchen 2-D echocardiography and Myoview stress testing back then which were all normal. He has since been obstructive hydrocephalus. He had an episode of syncope and had an event monitor that showed an episode of nonsustained ventricular tachycardia. He was referred to neurosurgeon at [facility] who subsequently referred him to an elective physiologist and ultimately an interventional cardiologist, Dr. Huel Davis, who performed cardiac catheterization on him 20/29/18 revealing a 80 % mid LAD lesion which was stented using Medtronic Onyx drug-eluting stents. The pt was treated with duel platelet therapy for 6 months and then underwent VP shunt Oct 2018. Two months later he had SDH and his ASA and Effient were discontinued.   He is in the office today for follow up. He denies chest pain or unusual dyspnea. He goe to an exercise program at the [facility]. He denies any issues with his medications.    Current Outpatient Medications  Medication Sig Dispense Refill  . amLODipine (NORVASC) 5 MG tablet Take 1 tablet (5 mg total) by mouth daily. 30 tablet 11  . CIALIS 20 MG tablet Take 1 tablet by mouth as needed.    . CRESTOR 20 MG tablet 20 mg daily.    . fluticasone (FLONASE) 50 MCG/ACT nasal spray Frequency:as needed   Dosage:50   MCG/ACT  Instructions:Fluticasone Propionate (50MCG/ACT SUSP, Nasal as needed)  Note:    . levothyroxine (SYNTHROID, LEVOTHROID) 125 MCG tablet 125 mcg daily.     Marland Kitchen lisinopril (PRINIVIL,ZESTRIL) 20 MG tablet Take 20 mg by mouth daily.     . metFORMIN (GLUCOPHAGE) 1000 MG tablet 1,000 mg 2 (two) times daily.    . metoprolol succinate (TOPROL-XL) 25 MG 24 hr tablet Take 1 tablet (25 mg total) by mouth daily. 90 tablet 2  . omeprazole (PRILOSEC) 40 MG capsule 40 mg as needed.    . pioglitazone (ACTOS) 45 MG tablet 45 mg daily.    . TRULICITY 0.75 MG/0.5ML SOPN Inject into the skin once a week.    Marland Kitchen ZETIA 10 MG tablet 10 mg daily.    Marland Kitchen zolpidem (AMBIEN) 10 MG tablet Take 1 tablet by mouth as needed.     No current facility-administered medications for this visit.     Allergies  Allergen Reactions  . Chlorhexidine Itching and Rash    Patient unsure, developed rash after surgery  . Ivp Dye [Iodinated Diagnostic Agents] Rash    Past Medical History:  Diagnosis Date  . Family history of heart disease   . Hyperlipidemia   . Hypertension   . Obesity   . Type 2 diabetes mellitus (HCC)     Social History   Socioeconomic History  . Marital status: Married    Spouse name: Not on file  . Number of children: Not on file  . Years of education: Not on file  . Highest education level: Not on file  Occupational History  . Not on file  Social Needs  . Financial resource strain: Not on file  .  Food insecurity:    Worry: Not on file    Inability: Not on file  . Transportation needs:    Medical: Not on file    Non-medical: Not on file  Tobacco Use  . Smoking status: Never Smoker  . Smokeless tobacco: Never Used  Substance and Sexual Activity  . Alcohol use: Not on file  . Drug use: Not on file  . Sexual activity: Not on file  Lifestyle  . Physical activity:    Days per week: Not on file    Minutes per session: Not on file  . Stress: Not on file  Relationships  . Social connections:    Talks on phone: Not on file    Gets together: Not on file    Attends religious service: Not on file    Active member of club or organization: Not on file     Attends meetings of clubs or organizations: Not on file    Relationship status: Not on file  . Intimate partner violence:    Fear of current or ex partner: Not on file    Emotionally abused: Not on file    Physically abused: Not on file    Forced sexual activity: Not on file  Other Topics Concern  . Not on file  Social History Narrative  . Not on file     Family History  Problem Relation Age of Onset  . Heart Problems Father   . Heart disease Brother      Review of Systems: General: negative for chills, fever, night sweats or weight changes.  Cardiovascular: negative for chest pain, dyspnea on exertion, edema, orthopnea, palpitations, paroxysmal nocturnal dyspnea or shortness of breath Dermatological: negative for rash Respiratory: negative for cough or wheezing Urologic: negative for hematuria Abdominal: negative for nausea, vomiting, diarrhea, bright red blood per rectum, melena, or hematemesis Neurologic: negative for visual changes, syncope, or dizziness All other systems reviewed and are otherwise negative except as noted above.    Blood pressure (!) 142/78, pulse 74, height 5\' 11"  (1.803 m), weight 226 lb 3.2 oz (102.6 kg).  General appearance: alert, cooperative and no distress Lungs: clear to auscultation bilaterally Heart: regular rate and rhythm Extremities: no edema Skin: cool and dry Neurologic: Grossly normal  EKG NSR-HR 74  ASSESSMENT AND PLAN:   CAD S/P percutaneous coronary angioplasty LAD PCI with DES May 2018 at [facility] after work up for syncope and NSVT  Normal pressure hydrocephalus S/P VP shunt Oct 2018 (after 6 months of duel antiplatelet therapy post PCI)  SDH (subdural hematoma) (HCC) Dec-Jan 2019 after VP shunt placed. Taken off ASA then  Non-insulin treated type 2 diabetes mellitus (HCC) On Glucopahge  Dyslipidemia, goal LDL below 70 On Zetia and Crestor-followed by Dr Alvin Davis  Essential hypertension Followed by Dr Alvin Davis   PLAN   [name] is doing well from cardac standpoint. He is exercising at the [facility]. He never really had classic angina symptoms. He says having the shunt placed helped him more than anything.   Will discuss with Dr Alvin Davis. From a cardiac standpoint we would like him to be on ASA 81 mg. This would need to be cleared by his neurosurgeon at [facility]. The pt did say he has recently had some mild headaches and I encouraged him to contact his neurosurgeon about this. I don't think he ever followed up with his interventional cardiologist at [facility]- I believe the plan was Dr Alvin Davis to follow him.   Corine Shelter PA-C 10/03/2017 4:12  PM

## 2017-10-03 NOTE — Assessment & Plan Note (Addendum)
S/P VP shunt Oct 2018 (after 6 months of duel antiplatelet therapy post PCI)

## 2017-10-03 NOTE — Assessment & Plan Note (Signed)
Dec-Jan 2019 after VP shunt placed. Taken off ASA then

## 2017-10-03 NOTE — Assessment & Plan Note (Signed)
On Zetia and Crestor-followed by Dr Lorin Picket

## 2017-11-06 ENCOUNTER — Telehealth: Payer: Self-pay | Admitting: Physician Assistant

## 2017-11-06 NOTE — Telephone Encounter (Signed)
New Message        Pharmacy calling to see if "Amlodipine 5 mg" has being filled? Pls call and advise.

## 2017-11-06 NOTE — Telephone Encounter (Signed)
Spoke to pharmacist--  Refill amlodipine 5 mg  one tablet daily  # 90  X 3 refill.

## 2017-11-10 ENCOUNTER — Other Ambulatory Visit: Payer: Self-pay

## 2017-11-10 MED ORDER — AMLODIPINE BESYLATE 5 MG PO TABS: 5.0000 mg | ORAL_TABLET | Freq: Every day | ORAL | 11 refills | Status: DC

## 2018-10-30 ENCOUNTER — Encounter: Payer: Self-pay | Admitting: Cardiovascular Disease

## 2018-10-30 ENCOUNTER — Other Ambulatory Visit: Payer: Self-pay

## 2018-10-30 ENCOUNTER — Ambulatory Visit: Payer: Medicare Other | Admitting: Cardiovascular Disease

## 2018-10-30 DIAGNOSIS — E785 Hyperlipidemia, unspecified: Secondary | ICD-10-CM | POA: Diagnosis not present

## 2018-10-30 DIAGNOSIS — I10 Essential (primary) hypertension: Secondary | ICD-10-CM | POA: Diagnosis not present

## 2018-10-30 DIAGNOSIS — I2510 Atherosclerotic heart disease of native coronary artery without angina pectoris: Secondary | ICD-10-CM | POA: Diagnosis not present

## 2018-10-30 DIAGNOSIS — Z9861 Coronary angioplasty status: Secondary | ICD-10-CM | POA: Diagnosis not present

## 2018-10-30 NOTE — Assessment & Plan Note (Signed)
History of essential hypertension with blood pressure measured today at 117/78.  He is on amlodipine and lisinopril

## 2018-10-30 NOTE — Assessment & Plan Note (Signed)
History of CAD status post mid LAD stenting by Dr. Lovena Neighbours at [facility] with Medtronic Onyx drug-eluting stent perform radially 04/25/2016.  He was on dual antiplatelet therapy which was discontinued because of a subdural hematoma.  I suggested that he start back on a baby aspirin if it is okay with his neurosurgeon.

## 2018-10-30 NOTE — Patient Instructions (Signed)

## 2018-10-30 NOTE — Progress Notes (Signed)
10/30/2018 Alvin Davis   November 07, 1945  [id number]  Primary Physician Lorin Picket Maryella Shivers, MD Primary Cardiologist: Runell Gess MD Nicholes Calamity, [place]  HPI:  Alvin Davis is a 73 y.o.  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 He was referred by Dr. Lorin Picket for cardiovascular evaluation because of increasing fatigue and dyspnea. I last saw him in the office 05/22/2016. His cardiac risk factor profile is notable for family history with a brother who had bypass grafting at age 58 Alvin Davis , patient of mine). He has treated type II diabetes, hypertension and hyperlipidemia. He is never had a heart attack or stroke. He had been complaining of increasing fatigue and dyspnea . I performed 2-D echocardiography and Myoview stress testing back then which were all normal. He has since been obstructive hydrocephalus. He had an episode of syncope and had an event monitor that showed an episode of nonsustained ventricular tachycardia. He was referred to neurosurgeon at [facility] to subtotally referred him to an elective physiologist and ultimately an interventional cardiologist, Dr. Huel Coventry, who performed cardiac catheterization on him 20/29/18 radially revealing a 80 % mid LAD lesion which was stented using Medtronic Onyx drug-eluting stents.  He was on dual antiplatelet therapy including aspirin and Effient which was discontinued after he developed a subdural hematoma.  Since I saw him in the office 2-1/2 years ago he did ultimately have a VP shunt for for hydrocephalus placed and subsequent developed a subdural hematoma.  He currently denies chest pain or shortness of breath.  Current Meds  Medication Sig  . amLODipine (NORVASC) 5 MG tablet Take 1 tablet (5 mg total) by mouth daily.  Marland Kitchen CIALIS 20 MG tablet Take 1 tablet by mouth as needed.  . CRESTOR 20 MG tablet 20 mg daily.  . fluticasone (FLONASE) 50 MCG/ACT nasal spray  Frequency:as needed   Dosage:50   MCG/ACT  Instructions:Fluticasone Propionate (50MCG/ACT SUSP, Nasal as needed)  Note:  . JARDIANCE 10 MG TABS tablet Take 10 mg by mouth daily.  Marland Kitchen levothyroxine (SYNTHROID, LEVOTHROID) 125 MCG tablet 125 mcg daily.  Marland Kitchen lisinopril (PRINIVIL,ZESTRIL) 20 MG tablet Take 20 mg by mouth daily.   . metFORMIN (GLUCOPHAGE) 1000 MG tablet 1,000 mg 2 (two) times daily.  Marland Kitchen omeprazole (PRILOSEC) 40 MG capsule 40 mg as needed.  . TRULICITY 0.75 MG/0.5ML SOPN Inject 1.5 mg/min into the skin once a week.   Marland Kitchen ZETIA 10 MG tablet 10 mg daily.  Marland Kitchen zolpidem (AMBIEN) 10 MG tablet Take 1 tablet by mouth as needed.  . [DISCONTINUED] pioglitazone (ACTOS) 45 MG tablet 45 mg daily.     Allergies  Allergen Reactions  . Chlorhexidine Itching and Rash    Patient unsure, developed rash after surgery  . Ivp Dye [Iodinated Diagnostic Agents] Rash    Social History   Socioeconomic History  . Marital status: Married    Spouse name: Not on file  . Number of children: Not on file  . Years of education: Not on file  . Highest education level: Not on file  Occupational History  . Not on file  Social Needs  . Financial resource strain: Not on file  . Food insecurity    Worry: Not on file    Inability: Not on file  . Transportation needs    Medical: Not on file    Non-medical: Not on file  Tobacco Use  . Smoking status: Never  Smoker  . Smokeless tobacco: Never Used  Substance and Sexual Activity  . Alcohol use: Not on file  . Drug use: Not on file  . Sexual activity: Not on file  Lifestyle  . Physical activity    Days per week: Not on file    Minutes per session: Not on file  . Stress: Not on file  Relationships  . Social Herbalist on phone: Not on file    Gets together: Not on file    Attends religious service: Not on file    Active member of club or organization: Not on file    Attends meetings of clubs or organizations: Not on file    Relationship status:  Not on file  . Intimate partner violence    Fear of current or ex partner: Not on file    Emotionally abused: Not on file    Physically abused: Not on file    Forced sexual activity: Not on file  Other Topics Concern  . Not on file  Social History Narrative  . Not on file     Review of Systems: General: negative for chills, fever, night sweats or weight changes.  Cardiovascular: negative for chest pain, dyspnea on exertion, edema, orthopnea, palpitations, paroxysmal nocturnal dyspnea or shortness of breath Dermatological: negative for rash Respiratory: negative for cough or wheezing Urologic: negative for hematuria Abdominal: negative for nausea, vomiting, diarrhea, bright red blood per rectum, melena, or hematemesis Neurologic: negative for visual changes, syncope, or dizziness All other systems reviewed and are otherwise negative except as noted above.    Blood pressure 117/78, pulse 78, temperature (!) 97 F (36.1 C), height 5\' 11"  (1.803 m), weight 213 lb 3.2 oz (96.7 kg), SpO2 96 %.  General appearance: alert and no distress Neck: no adenopathy, no carotid bruit, no JVD, supple, symmetrical, trachea midline and thyroid not enlarged, symmetric, no tenderness/mass/nodules Lungs: clear to auscultation bilaterally Heart: regular rate and rhythm, S1, S2 normal, no murmur, click, rub or gallop Extremities: extremities normal, atraumatic, no cyanosis or edema Pulses: 2+ and symmetric Skin: Skin color, texture, turgor normal. No rashes or lesions Neurologic: Alert and oriented X 3, normal strength and tone. Normal symmetric reflexes. Normal coordination and gait  EKG sinus rhythm at 73 without ST or T wave changes.  I personally reviewed this EKG.  ASSESSMENT AND PLAN:   Essential hypertension History of essential hypertension with blood pressure measured today at 117/78.  He is on amlodipine and lisinopril  Dyslipidemia, goal LDL below 70 History of dyslipidemia on low-dose  Crestor with lipid profile performed 06/09/2018 revealing total cholesterol 114, LDL of 52 and HDL 42.  CAD S/P percutaneous coronary angioplasty History of CAD status post mid LAD stenting by Dr. Lovena Neighbours at [facility] with Medtronic Onyx drug-eluting stent perform radially 04/25/2016.  He was on dual antiplatelet therapy which was discontinued because of a subdural hematoma.  I suggested that he start back on a baby aspirin if it is okay with his neurosurgeon.      Lorretta Harp MD FACP,FACC,FAHA, [facility] 10/30/2018 11:06 AM

## 2018-10-30 NOTE — Assessment & Plan Note (Signed)
History of dyslipidemia on low-dose Crestor with lipid profile performed 06/09/2018 revealing total cholesterol 114, LDL of 52 and HDL 42.

## 2018-11-07 ENCOUNTER — Other Ambulatory Visit: Payer: Self-pay | Admitting: Cardiovascular Disease

## 2019-03-18 ENCOUNTER — Other Ambulatory Visit: Payer: Self-pay | Admitting: Cardiology

## 2019-05-03 DIAGNOSIS — Z6829 Body mass index (BMI) 29.0-29.9, adult: Secondary | ICD-10-CM | POA: Diagnosis not present

## 2019-05-03 DIAGNOSIS — R32 Unspecified urinary incontinence: Secondary | ICD-10-CM | POA: Diagnosis not present

## 2019-05-06 DIAGNOSIS — Z125 Encounter for screening for malignant neoplasm of prostate: Secondary | ICD-10-CM | POA: Diagnosis not present

## 2019-05-06 DIAGNOSIS — N432 Other hydrocele: Secondary | ICD-10-CM | POA: Diagnosis not present

## 2019-05-06 DIAGNOSIS — R32 Unspecified urinary incontinence: Secondary | ICD-10-CM | POA: Diagnosis not present

## 2019-05-17 DIAGNOSIS — L988 Other specified disorders of the skin and subcutaneous tissue: Secondary | ICD-10-CM | POA: Diagnosis not present

## 2019-05-17 DIAGNOSIS — C44629 Squamous cell carcinoma of skin of left upper limb, including shoulder: Secondary | ICD-10-CM | POA: Diagnosis not present

## 2019-06-01 DIAGNOSIS — L570 Actinic keratosis: Secondary | ICD-10-CM | POA: Diagnosis not present

## 2019-06-01 DIAGNOSIS — Z85828 Personal history of other malignant neoplasm of skin: Secondary | ICD-10-CM | POA: Diagnosis not present

## 2019-08-09 DIAGNOSIS — E039 Hypothyroidism, unspecified: Secondary | ICD-10-CM | POA: Diagnosis not present

## 2019-08-09 DIAGNOSIS — E1165 Type 2 diabetes mellitus with hyperglycemia: Secondary | ICD-10-CM | POA: Diagnosis not present

## 2019-08-09 DIAGNOSIS — Z79899 Other long term (current) drug therapy: Secondary | ICD-10-CM | POA: Diagnosis not present

## 2019-08-23 DIAGNOSIS — I10 Essential (primary) hypertension: Secondary | ICD-10-CM | POA: Diagnosis not present

## 2019-08-23 DIAGNOSIS — Z6828 Body mass index (BMI) 28.0-28.9, adult: Secondary | ICD-10-CM | POA: Diagnosis not present

## 2019-08-23 DIAGNOSIS — E1165 Type 2 diabetes mellitus with hyperglycemia: Secondary | ICD-10-CM | POA: Diagnosis not present

## 2019-08-23 DIAGNOSIS — Z1331 Encounter for screening for depression: Secondary | ICD-10-CM | POA: Diagnosis not present

## 2019-08-23 DIAGNOSIS — I2510 Atherosclerotic heart disease of native coronary artery without angina pectoris: Secondary | ICD-10-CM | POA: Diagnosis not present

## 2019-08-23 DIAGNOSIS — Z0000 Encounter for general adult medical examination without abnormal findings: Secondary | ICD-10-CM | POA: Diagnosis not present

## 2019-11-29 DIAGNOSIS — J189 Pneumonia, unspecified organism: Secondary | ICD-10-CM | POA: Diagnosis not present

## 2019-11-29 DIAGNOSIS — I2510 Atherosclerotic heart disease of native coronary artery without angina pectoris: Secondary | ICD-10-CM | POA: Diagnosis not present

## 2019-12-31 DIAGNOSIS — G912 (Idiopathic) normal pressure hydrocephalus: Secondary | ICD-10-CM | POA: Diagnosis not present

## 2019-12-31 DIAGNOSIS — R930 Abnormal findings on diagnostic imaging of skull and head, not elsewhere classified: Secondary | ICD-10-CM | POA: Diagnosis not present

## 2019-12-31 DIAGNOSIS — Z982 Presence of cerebrospinal fluid drainage device: Secondary | ICD-10-CM | POA: Diagnosis not present

## 2019-12-31 DIAGNOSIS — Z6828 Body mass index (BMI) 28.0-28.9, adult: Secondary | ICD-10-CM | POA: Diagnosis not present

## 2019-12-31 DIAGNOSIS — R32 Unspecified urinary incontinence: Secondary | ICD-10-CM | POA: Diagnosis not present

## 2020-01-07 DIAGNOSIS — N3941 Urge incontinence: Secondary | ICD-10-CM | POA: Diagnosis not present

## 2020-01-07 DIAGNOSIS — E119 Type 2 diabetes mellitus without complications: Secondary | ICD-10-CM | POA: Diagnosis not present

## 2020-01-07 DIAGNOSIS — Z9049 Acquired absence of other specified parts of digestive tract: Secondary | ICD-10-CM | POA: Diagnosis not present

## 2020-01-07 DIAGNOSIS — Z982 Presence of cerebrospinal fluid drainage device: Secondary | ICD-10-CM | POA: Diagnosis not present

## 2020-01-07 DIAGNOSIS — E039 Hypothyroidism, unspecified: Secondary | ICD-10-CM | POA: Diagnosis not present

## 2020-01-07 DIAGNOSIS — G912 (Idiopathic) normal pressure hydrocephalus: Secondary | ICD-10-CM | POA: Diagnosis not present

## 2020-01-07 DIAGNOSIS — Z7982 Long term (current) use of aspirin: Secondary | ICD-10-CM | POA: Diagnosis not present

## 2020-01-07 DIAGNOSIS — I10 Essential (primary) hypertension: Secondary | ICD-10-CM | POA: Diagnosis not present

## 2020-01-07 DIAGNOSIS — Z7984 Long term (current) use of oral hypoglycemic drugs: Secondary | ICD-10-CM | POA: Diagnosis not present

## 2020-01-07 DIAGNOSIS — Z6828 Body mass index (BMI) 28.0-28.9, adult: Secondary | ICD-10-CM | POA: Diagnosis not present

## 2020-01-07 DIAGNOSIS — R32 Unspecified urinary incontinence: Secondary | ICD-10-CM | POA: Diagnosis not present

## 2020-01-07 DIAGNOSIS — E785 Hyperlipidemia, unspecified: Secondary | ICD-10-CM | POA: Diagnosis not present

## 2020-01-31 DIAGNOSIS — Z79899 Other long term (current) drug therapy: Secondary | ICD-10-CM | POA: Diagnosis not present

## 2020-01-31 DIAGNOSIS — Z0000 Encounter for general adult medical examination without abnormal findings: Secondary | ICD-10-CM | POA: Diagnosis not present

## 2020-01-31 DIAGNOSIS — Z125 Encounter for screening for malignant neoplasm of prostate: Secondary | ICD-10-CM | POA: Diagnosis not present

## 2020-01-31 DIAGNOSIS — E1165 Type 2 diabetes mellitus with hyperglycemia: Secondary | ICD-10-CM | POA: Diagnosis not present

## 2020-01-31 DIAGNOSIS — Z6828 Body mass index (BMI) 28.0-28.9, adult: Secondary | ICD-10-CM | POA: Diagnosis not present

## 2020-02-09 DIAGNOSIS — D649 Anemia, unspecified: Secondary | ICD-10-CM | POA: Diagnosis not present

## 2020-03-21 ENCOUNTER — Other Ambulatory Visit: Payer: Self-pay | Admitting: Cardiology

## 2020-04-20 DIAGNOSIS — G912 (Idiopathic) normal pressure hydrocephalus: Secondary | ICD-10-CM | POA: Diagnosis not present

## 2020-04-20 DIAGNOSIS — Z982 Presence of cerebrospinal fluid drainage device: Secondary | ICD-10-CM | POA: Diagnosis not present

## 2020-04-20 DIAGNOSIS — R32 Unspecified urinary incontinence: Secondary | ICD-10-CM | POA: Diagnosis not present

## 2020-04-20 DIAGNOSIS — Z6828 Body mass index (BMI) 28.0-28.9, adult: Secondary | ICD-10-CM | POA: Diagnosis not present

## 2020-05-23 ENCOUNTER — Other Ambulatory Visit: Payer: Self-pay | Admitting: Cardiovascular Disease

## 2020-06-20 ENCOUNTER — Other Ambulatory Visit: Payer: Self-pay | Admitting: Cardiovascular Disease

## 2020-06-20 ENCOUNTER — Telehealth: Payer: Self-pay | Admitting: Cardiovascular Disease

## 2020-06-20 NOTE — Telephone Encounter (Signed)
*  STAT* If patient is at the pharmacy, call can be transferred to refill team.   1. Which medications need to be refilled? (please list name of each medication and dose if known)  metoprolol succinate (TOPROL-XL) 25 MG 24 hr tablet  2. Which pharmacy/location (including street and city if local pharmacy) is medication to be sent to? [facility] - [place], [place] - [address]  3. Do they need a 30 day or 90 day supply? 90 day

## 2020-07-18 ENCOUNTER — Other Ambulatory Visit: Payer: Self-pay | Admitting: Cardiovascular Disease

## 2020-08-24 DIAGNOSIS — Z79899 Other long term (current) drug therapy: Secondary | ICD-10-CM | POA: Diagnosis not present

## 2020-08-24 DIAGNOSIS — Z1331 Encounter for screening for depression: Secondary | ICD-10-CM | POA: Diagnosis not present

## 2020-08-24 DIAGNOSIS — D509 Iron deficiency anemia, unspecified: Secondary | ICD-10-CM | POA: Diagnosis not present

## 2020-08-24 DIAGNOSIS — Z0000 Encounter for general adult medical examination without abnormal findings: Secondary | ICD-10-CM | POA: Diagnosis not present

## 2020-08-24 DIAGNOSIS — I10 Essential (primary) hypertension: Secondary | ICD-10-CM | POA: Diagnosis not present

## 2020-08-24 DIAGNOSIS — E1165 Type 2 diabetes mellitus with hyperglycemia: Secondary | ICD-10-CM | POA: Diagnosis not present

## 2020-08-24 DIAGNOSIS — Z6828 Body mass index (BMI) 28.0-28.9, adult: Secondary | ICD-10-CM | POA: Diagnosis not present

## 2020-09-08 ENCOUNTER — Ambulatory Visit: Payer: Medicare PPO | Admitting: Cardiovascular Disease

## 2020-09-08 ENCOUNTER — Other Ambulatory Visit: Payer: Self-pay

## 2020-09-08 ENCOUNTER — Encounter: Payer: Self-pay | Admitting: Cardiovascular Disease

## 2020-09-08 VITALS — BP 122/74 | HR 76 | Ht 71.0 in | Wt 203.4 lb

## 2020-09-08 DIAGNOSIS — Z9861 Coronary angioplasty status: Secondary | ICD-10-CM

## 2020-09-08 DIAGNOSIS — E785 Hyperlipidemia, unspecified: Secondary | ICD-10-CM | POA: Diagnosis not present

## 2020-09-08 DIAGNOSIS — I2510 Atherosclerotic heart disease of native coronary artery without angina pectoris: Secondary | ICD-10-CM | POA: Diagnosis not present

## 2020-09-08 DIAGNOSIS — I10 Essential (primary) hypertension: Secondary | ICD-10-CM | POA: Diagnosis not present

## 2020-09-08 NOTE — Assessment & Plan Note (Signed)
History of essential hypertension blood pressure measured today at 122/74.  He is on lisinopril, amlodipine and metoprolol.

## 2020-09-08 NOTE — Assessment & Plan Note (Signed)
History of CAD status post mid LAD PCI and stenting using a Medtronic Onyx drug-eluting stent at [facility] by Dr. Huel Coventry 04/25/2016.  He was on aspirin and Effient.  He no longer is on Effient and takes aspirin sporadically.  I emphasized the importance of taking aspirin daily.

## 2020-09-08 NOTE — Progress Notes (Addendum)
12/27/2020 [name]   01-17-1946  [id number]  Primary Physician Jeanie Sewer Valrie Hart, MD Primary Cardiologist: Runell Gess MD Nicholes Calamity, [place]  HPI:  Alvin Davis is a 75 y.o.  mildly overweight married Caucasian male father of 2 children (daughter is a Customer service manager, son is a International aid/development worker) who is retired Nurse, learning disability. He was referred by Dr. Lorin Picket for cardiovascular evaluation because of increasing fatigue and dyspnea. I last saw him in the office 10/30/2018. His cardiac risk factor profile is notable for family history with a brother who had bypass grafting at age 26 [facility] , patient of mine). He has treated type II diabetes, hypertension and hyperlipidemia. He is never had a heart attack or stroke. He had been complaining of increasing fatigue and dyspnea . I performed 2-D echocardiography and Myoview stress testing back then which were all normal. He has since been obstructive hydrocephalus. He had an episode of syncope and had an event monitor that showed an episode of nonsustained ventricular tachycardia. He was referred to neurosurgeon at [facility] to subtotally referred him to an elective physiologist and ultimately an interventional cardiologist, Dr. Huel Coventry, who performed cardiac catheterization on him 20/29/18 radially revealing a 80 % mid LAD lesion which was stented using Medtronic Onyx drug-eluting stents.  He was on dual antiplatelet therapy including aspirin and Effient which was discontinued after he developed a subdural hematoma.   Since I saw him in the office 2 years ago he continues to do well.  He has had no recurrent cardiovascular symptoms.  He was on Effient which was discontinued and currently takes aspirin intermittently.  Current Meds  Medication Sig   CIALIS 20 MG tablet Take 1 tablet by mouth as needed.   CRESTOR 20 MG tablet 20 mg daily.   fluticasone (FLONASE) 50 MCG/ACT nasal spray Frequency:as needed   Dosage:50    MCG/ACT  Instructions:Fluticasone Propionate (50MCG/ACT SUSP, Nasal as needed)  Note:   JARDIANCE 10 MG TABS tablet Take 10 mg by mouth daily.   levothyroxine (SYNTHROID, LEVOTHROID) 125 MCG tablet 125 mcg daily.   lisinopril (PRINIVIL,ZESTRIL) 20 MG tablet Take 20 mg by mouth daily.    metFORMIN (GLUCOPHAGE) 1000 MG tablet 1,000 mg 2 (two) times daily.   omeprazole (PRILOSEC) 40 MG capsule 40 mg as needed.   TRULICITY 0.75 MG/0.5ML SOPN Inject 1.5 mg/min into the skin once a week.    ZETIA 10 MG tablet 10 mg daily.   zolpidem (AMBIEN) 10 MG tablet Take 1 tablet by mouth as needed.   [DISCONTINUED] metoprolol succinate (TOPROL-XL) 25 MG 24 hr tablet Take 1 tablet (25 mg total) by mouth daily. KEEP OV.     Allergies  Allergen Reactions   Chlorhexidine Itching and Rash    Patient unsure, developed rash after surgery   Ivp Dye [Iodinated Diagnostic Agents] Rash    Social History   Socioeconomic History   Marital status: Married    Spouse name: Not on file   Number of children: Not on file   Years of education: Not on file   Highest education level: Not on file  Occupational History   Not on file  Tobacco Use   Smoking status: Never   Smokeless tobacco: Never  Substance and Sexual Activity   Alcohol use: Not on file   Drug use: Not on file   Sexual activity: Not on file  Other Topics Concern   Not on file  Social  History Narrative   Not on file   Social Determinants of Health   Financial Resource Strain: Not on file  Food Insecurity: Not on file  Transportation Needs: Not on file  Physical Activity: Not on file  Stress: Not on file  Social Connections: Not on file  Intimate Partner Violence: Not on file     Review of Systems: General: negative for chills, fever, night sweats or weight changes.  Cardiovascular: negative for chest pain, dyspnea on exertion, edema, orthopnea, palpitations, paroxysmal nocturnal dyspnea or shortness of breath Dermatological: negative for  rash Respiratory: negative for cough or wheezing Urologic: negative for hematuria Abdominal: negative for nausea, vomiting, diarrhea, bright red blood per rectum, melena, or hematemesis Neurologic: negative for visual changes, syncope, or dizziness All other systems reviewed and are otherwise negative except as noted above.    Blood pressure 122/74, pulse 76, height 5\' 11"  (1.803 m), weight 203 lb 6.4 oz (92.3 kg), SpO2 98 %.  General appearance: alert and no distress Neck: no adenopathy, no carotid bruit, no JVD, supple, symmetrical, trachea midline, and thyroid not enlarged, symmetric, no tenderness/mass/nodules Lungs: clear to auscultation bilaterally Heart: regular rate and rhythm, S1, S2 normal, no murmur, click, rub or gallop Extremities: extremities normal, atraumatic, no cyanosis or edema Pulses: 2+ and symmetric Skin: Skin color, texture, turgor normal. No rashes or lesions Neurologic: Grossly normal  EKG sinus rhythm at 76 without ST or T wave changes.  I personally reviewed this EKG.  ASSESSMENT AND PLAN:   Essential hypertension History of essential hypertension blood pressure measured today at 122/74.  He is on lisinopril, amlodipine and metoprolol.  Dyslipidemia, goal LDL below 70 History of dyslipidemia on statin therapy as well as Zetia followed by his PCP.  His most recent lipid profile performed 01/31/2020 revealed total cholesterol 174, LDL of 95 and HDL 41.  Based on this, I am going to increase his Crestor from 20 mg to 40 mg and we will recheck an FLP in 3 months.  CAD S/P percutaneous coronary angioplasty History of CAD status post mid LAD PCI and stenting using a Medtronic Onyx drug-eluting stent at [facility] by Dr. [facility] 04/25/2016.  He was on aspirin and Effient.  He no longer is on Effient and takes aspirin sporadically.  I emphasized the importance of taking aspirin daily.     04/27/2016 MD FACP,FACC,FAHA, [facility] 12/27/2020 1:24 PM

## 2020-09-08 NOTE — Assessment & Plan Note (Addendum)
History of dyslipidemia on statin therapy as well as Zetia followed by his PCP.  His most recent lipid profile performed 01/31/2020 revealed total cholesterol 174, LDL of 95 and HDL 41.  Based on this, I am going to increase his Crestor from 20 mg to 40 mg and we will recheck an FLP in 3 months.

## 2020-09-08 NOTE — Patient Instructions (Signed)

## 2020-09-20 DIAGNOSIS — D649 Anemia, unspecified: Secondary | ICD-10-CM | POA: Diagnosis not present

## 2020-09-20 DIAGNOSIS — K649 Unspecified hemorrhoids: Secondary | ICD-10-CM | POA: Diagnosis not present

## 2020-09-20 DIAGNOSIS — K5900 Constipation, unspecified: Secondary | ICD-10-CM | POA: Diagnosis not present

## 2020-10-05 DIAGNOSIS — Z1211 Encounter for screening for malignant neoplasm of colon: Secondary | ICD-10-CM | POA: Diagnosis not present

## 2020-10-05 DIAGNOSIS — K449 Diaphragmatic hernia without obstruction or gangrene: Secondary | ICD-10-CM | POA: Diagnosis not present

## 2020-10-05 DIAGNOSIS — D126 Benign neoplasm of colon, unspecified: Secondary | ICD-10-CM | POA: Diagnosis not present

## 2020-10-05 DIAGNOSIS — K635 Polyp of colon: Secondary | ICD-10-CM | POA: Diagnosis not present

## 2020-10-05 DIAGNOSIS — Z7984 Long term (current) use of oral hypoglycemic drugs: Secondary | ICD-10-CM | POA: Diagnosis not present

## 2020-10-05 DIAGNOSIS — E119 Type 2 diabetes mellitus without complications: Secondary | ICD-10-CM | POA: Diagnosis not present

## 2020-10-05 DIAGNOSIS — K219 Gastro-esophageal reflux disease without esophagitis: Secondary | ICD-10-CM | POA: Diagnosis not present

## 2020-10-05 DIAGNOSIS — Z79899 Other long term (current) drug therapy: Secondary | ICD-10-CM | POA: Diagnosis not present

## 2020-10-05 DIAGNOSIS — D124 Benign neoplasm of descending colon: Secondary | ICD-10-CM | POA: Diagnosis not present

## 2020-10-05 DIAGNOSIS — K2970 Gastritis, unspecified, without bleeding: Secondary | ICD-10-CM | POA: Diagnosis not present

## 2020-10-05 DIAGNOSIS — D509 Iron deficiency anemia, unspecified: Secondary | ICD-10-CM | POA: Diagnosis not present

## 2020-10-05 DIAGNOSIS — K644 Residual hemorrhoidal skin tags: Secondary | ICD-10-CM | POA: Diagnosis not present

## 2020-10-10 DIAGNOSIS — H524 Presbyopia: Secondary | ICD-10-CM | POA: Diagnosis not present

## 2020-10-10 DIAGNOSIS — G919 Hydrocephalus, unspecified: Secondary | ICD-10-CM | POA: Diagnosis not present

## 2020-10-10 DIAGNOSIS — E119 Type 2 diabetes mellitus without complications: Secondary | ICD-10-CM | POA: Diagnosis not present

## 2020-10-10 DIAGNOSIS — H25813 Combined forms of age-related cataract, bilateral: Secondary | ICD-10-CM | POA: Diagnosis not present

## 2020-11-09 ENCOUNTER — Other Ambulatory Visit: Payer: Self-pay | Admitting: Cardiovascular Disease

## 2020-11-27 DIAGNOSIS — I10 Essential (primary) hypertension: Secondary | ICD-10-CM | POA: Diagnosis not present

## 2020-11-27 DIAGNOSIS — E1165 Type 2 diabetes mellitus with hyperglycemia: Secondary | ICD-10-CM | POA: Diagnosis not present

## 2020-11-27 DIAGNOSIS — E039 Hypothyroidism, unspecified: Secondary | ICD-10-CM | POA: Diagnosis not present

## 2020-12-12 DIAGNOSIS — Z982 Presence of cerebrospinal fluid drainage device: Secondary | ICD-10-CM | POA: Diagnosis not present

## 2020-12-12 DIAGNOSIS — G912 (Idiopathic) normal pressure hydrocephalus: Secondary | ICD-10-CM | POA: Diagnosis not present

## 2020-12-12 DIAGNOSIS — Z79899 Other long term (current) drug therapy: Secondary | ICD-10-CM | POA: Diagnosis not present

## 2020-12-12 DIAGNOSIS — R32 Unspecified urinary incontinence: Secondary | ICD-10-CM | POA: Diagnosis not present

## 2020-12-12 DIAGNOSIS — M4856XA Collapsed vertebra, not elsewhere classified, lumbar region, initial encounter for fracture: Secondary | ICD-10-CM | POA: Diagnosis not present

## 2020-12-12 DIAGNOSIS — Z6828 Body mass index (BMI) 28.0-28.9, adult: Secondary | ICD-10-CM | POA: Diagnosis not present

## 2020-12-14 ENCOUNTER — Other Ambulatory Visit (HOSPITAL_COMMUNITY): Payer: Self-pay | Admitting: Critical Care Medicine

## 2020-12-14 DIAGNOSIS — Z982 Presence of cerebrospinal fluid drainage device: Secondary | ICD-10-CM

## 2020-12-14 DIAGNOSIS — G912 (Idiopathic) normal pressure hydrocephalus: Secondary | ICD-10-CM

## 2020-12-18 DIAGNOSIS — M25512 Pain in left shoulder: Secondary | ICD-10-CM | POA: Diagnosis not present

## 2020-12-18 DIAGNOSIS — M25552 Pain in left hip: Secondary | ICD-10-CM | POA: Diagnosis not present

## 2020-12-20 ENCOUNTER — Ambulatory Visit (HOSPITAL_COMMUNITY)
Admission: RE | Admit: 2020-12-20 | Discharge: 2020-12-20 | Disposition: A | Discharge status: Home / Self Care | Payer: Medicare PPO | Source: Ambulatory Visit | Attending: Critical Care Medicine | Admitting: Critical Care Medicine

## 2020-12-20 ENCOUNTER — Other Ambulatory Visit: Payer: Self-pay

## 2020-12-20 DIAGNOSIS — Z982 Presence of cerebrospinal fluid drainage device: Secondary | ICD-10-CM | POA: Insufficient documentation

## 2020-12-20 DIAGNOSIS — G912 (Idiopathic) normal pressure hydrocephalus: Secondary | ICD-10-CM | POA: Insufficient documentation

## 2020-12-27 DIAGNOSIS — E1165 Type 2 diabetes mellitus with hyperglycemia: Secondary | ICD-10-CM | POA: Diagnosis not present

## 2020-12-27 DIAGNOSIS — I2510 Atherosclerotic heart disease of native coronary artery without angina pectoris: Secondary | ICD-10-CM | POA: Diagnosis not present

## 2020-12-27 DIAGNOSIS — I10 Essential (primary) hypertension: Secondary | ICD-10-CM | POA: Diagnosis not present

## 2020-12-28 DIAGNOSIS — I10 Essential (primary) hypertension: Secondary | ICD-10-CM | POA: Diagnosis not present

## 2020-12-28 DIAGNOSIS — R9389 Abnormal findings on diagnostic imaging of other specified body structures: Secondary | ICD-10-CM | POA: Diagnosis not present

## 2020-12-28 DIAGNOSIS — Z8673 Personal history of transient ischemic attack (TIA), and cerebral infarction without residual deficits: Secondary | ICD-10-CM | POA: Diagnosis not present

## 2020-12-28 DIAGNOSIS — Z7982 Long term (current) use of aspirin: Secondary | ICD-10-CM | POA: Diagnosis not present

## 2020-12-28 DIAGNOSIS — G912 (Idiopathic) normal pressure hydrocephalus: Secondary | ICD-10-CM | POA: Diagnosis not present

## 2020-12-28 DIAGNOSIS — Z982 Presence of cerebrospinal fluid drainage device: Secondary | ICD-10-CM | POA: Diagnosis not present

## 2020-12-28 DIAGNOSIS — R32 Unspecified urinary incontinence: Secondary | ICD-10-CM | POA: Diagnosis not present

## 2020-12-28 DIAGNOSIS — R269 Unspecified abnormalities of gait and mobility: Secondary | ICD-10-CM | POA: Diagnosis not present

## 2020-12-28 DIAGNOSIS — Z79899 Other long term (current) drug therapy: Secondary | ICD-10-CM | POA: Diagnosis not present

## 2021-01-03 DIAGNOSIS — Z1211 Encounter for screening for malignant neoplasm of colon: Secondary | ICD-10-CM | POA: Diagnosis not present

## 2021-01-08 DIAGNOSIS — R32 Unspecified urinary incontinence: Secondary | ICD-10-CM | POA: Diagnosis not present

## 2021-01-08 DIAGNOSIS — G912 (Idiopathic) normal pressure hydrocephalus: Secondary | ICD-10-CM | POA: Diagnosis not present

## 2021-01-26 DIAGNOSIS — I10 Essential (primary) hypertension: Secondary | ICD-10-CM | POA: Diagnosis not present

## 2021-01-26 DIAGNOSIS — Z982 Presence of cerebrospinal fluid drainage device: Secondary | ICD-10-CM | POA: Diagnosis not present

## 2021-01-26 DIAGNOSIS — I6381 Other cerebral infarction due to occlusion or stenosis of small artery: Secondary | ICD-10-CM | POA: Diagnosis not present

## 2021-02-20 DIAGNOSIS — E039 Hypothyroidism, unspecified: Secondary | ICD-10-CM | POA: Diagnosis not present

## 2021-02-20 DIAGNOSIS — E1165 Type 2 diabetes mellitus with hyperglycemia: Secondary | ICD-10-CM | POA: Diagnosis not present

## 2021-02-27 DIAGNOSIS — E1165 Type 2 diabetes mellitus with hyperglycemia: Secondary | ICD-10-CM | POA: Diagnosis not present

## 2021-02-27 DIAGNOSIS — I10 Essential (primary) hypertension: Secondary | ICD-10-CM | POA: Diagnosis not present

## 2021-02-27 DIAGNOSIS — I2510 Atherosclerotic heart disease of native coronary artery without angina pectoris: Secondary | ICD-10-CM | POA: Diagnosis not present

## 2021-03-01 DIAGNOSIS — I10 Essential (primary) hypertension: Secondary | ICD-10-CM | POA: Diagnosis not present

## 2021-03-01 DIAGNOSIS — I639 Cerebral infarction, unspecified: Secondary | ICD-10-CM | POA: Diagnosis not present

## 2021-03-01 DIAGNOSIS — R9389 Abnormal findings on diagnostic imaging of other specified body structures: Secondary | ICD-10-CM | POA: Diagnosis not present

## 2021-03-05 ENCOUNTER — Telehealth: Payer: Self-pay | Admitting: Cardiovascular Disease

## 2021-03-05 NOTE — Telephone Encounter (Signed)
I spoke with the patient.  He states he was recently seen by [facility] to follow up on his normal pressure hydrocephalus. Per the patient- Dr. Regino Schultze is the doctor he saw and he was recommending a follow up echocardiogram & carotid studies to be done.  The patient was wanting Dr. Hazle Coca opinion on this. He advised it would be closer for him to have the studies in [place], but could still make it to [facility] if needed for testing.  I advised the patient I will forward to Dr. Allyson Sabal to review. I have also advised if Dr. Allyson Sabal feels testing can be done in [place], we may need an order from [facility] for testing.  The patient is aware we will be back in touch with Dr. Hazle Coca recommendations.  He voices understanding and is agreeable.

## 2021-03-05 NOTE — Telephone Encounter (Signed)
Patient states his neurologist would like for him to have an echocardiogram and he would like to know if Dr. Allyson Sabal agrees. If so, an order is needed.

## 2021-03-06 NOTE — Telephone Encounter (Signed)
Runell Gess, MD  Sent: Tue March 06, 2021  4:56 PM  To: Jefferey Pica, RN          Message  I have no problems getting a 2D echo and carotid Dopplers in our practice.

## 2021-03-07 ENCOUNTER — Other Ambulatory Visit: Payer: Self-pay | Admitting: Cardiovascular Disease

## 2021-03-07 ENCOUNTER — Encounter: Payer: Self-pay | Admitting: Cardiovascular Disease

## 2021-03-07 DIAGNOSIS — G919 Hydrocephalus, unspecified: Secondary | ICD-10-CM | POA: Diagnosis not present

## 2021-03-07 DIAGNOSIS — I6523 Occlusion and stenosis of bilateral carotid arteries: Secondary | ICD-10-CM | POA: Diagnosis not present

## 2021-03-07 DIAGNOSIS — Z982 Presence of cerebrospinal fluid drainage device: Secondary | ICD-10-CM | POA: Diagnosis not present

## 2021-03-07 DIAGNOSIS — Z8673 Personal history of transient ischemic attack (TIA), and cerebral infarction without residual deficits: Secondary | ICD-10-CM | POA: Diagnosis not present

## 2021-03-07 DIAGNOSIS — I10 Essential (primary) hypertension: Secondary | ICD-10-CM | POA: Diagnosis not present

## 2021-03-07 DIAGNOSIS — E785 Hyperlipidemia, unspecified: Secondary | ICD-10-CM | POA: Diagnosis not present

## 2021-03-07 DIAGNOSIS — I639 Cerebral infarction, unspecified: Secondary | ICD-10-CM | POA: Diagnosis not present

## 2021-03-07 NOTE — Telephone Encounter (Signed)
error 

## 2021-03-07 NOTE — Telephone Encounter (Signed)
Patient called saying they did the carotid study today.  They are referring him to a thoracic surgeon.  He wants to know if the echo is still neccessary and if Dr. Allyson Sabal specializes in thoracic surgery.  He said Dr. Allyson Sabal should be able to see the records from [facility] on [address].

## 2021-03-07 NOTE — Telephone Encounter (Signed)
Left detailed message-- looks like pt's neurologist was the one that wanted to have an echocardiogram. He will have to ask them.

## 2021-03-15 DIAGNOSIS — R0683 Snoring: Secondary | ICD-10-CM | POA: Diagnosis not present

## 2021-03-15 DIAGNOSIS — G4730 Sleep apnea, unspecified: Secondary | ICD-10-CM | POA: Diagnosis not present

## 2021-03-15 DIAGNOSIS — Z982 Presence of cerebrospinal fluid drainage device: Secondary | ICD-10-CM | POA: Diagnosis not present

## 2021-03-15 DIAGNOSIS — I639 Cerebral infarction, unspecified: Secondary | ICD-10-CM | POA: Diagnosis not present

## 2021-03-15 DIAGNOSIS — G4763 Sleep related bruxism: Secondary | ICD-10-CM | POA: Diagnosis not present

## 2021-03-15 DIAGNOSIS — G478 Other sleep disorders: Secondary | ICD-10-CM | POA: Diagnosis not present

## 2021-03-15 DIAGNOSIS — G912 (Idiopathic) normal pressure hydrocephalus: Secondary | ICD-10-CM | POA: Diagnosis not present

## 2021-03-15 DIAGNOSIS — I472 Ventricular tachycardia, unspecified: Secondary | ICD-10-CM | POA: Diagnosis not present

## 2021-03-15 DIAGNOSIS — R0681 Apnea, not elsewhere classified: Secondary | ICD-10-CM | POA: Diagnosis not present

## 2021-03-15 DIAGNOSIS — I2510 Atherosclerotic heart disease of native coronary artery without angina pectoris: Secondary | ICD-10-CM | POA: Diagnosis not present

## 2021-04-06 DIAGNOSIS — I639 Cerebral infarction, unspecified: Secondary | ICD-10-CM | POA: Diagnosis not present

## 2021-04-06 DIAGNOSIS — I371 Nonrheumatic pulmonary valve insufficiency: Secondary | ICD-10-CM | POA: Diagnosis not present

## 2021-04-09 DIAGNOSIS — I6523 Occlusion and stenosis of bilateral carotid arteries: Secondary | ICD-10-CM | POA: Diagnosis not present

## 2021-04-09 DIAGNOSIS — I6521 Occlusion and stenosis of right carotid artery: Secondary | ICD-10-CM | POA: Diagnosis not present

## 2021-04-10 DIAGNOSIS — I6521 Occlusion and stenosis of right carotid artery: Secondary | ICD-10-CM | POA: Diagnosis not present

## 2021-04-10 DIAGNOSIS — Z419 Encounter for procedure for purposes other than remedying health state, unspecified: Secondary | ICD-10-CM | POA: Diagnosis not present

## 2021-04-27 DIAGNOSIS — I10 Essential (primary) hypertension: Secondary | ICD-10-CM | POA: Diagnosis not present

## 2021-04-27 DIAGNOSIS — E039 Hypothyroidism, unspecified: Secondary | ICD-10-CM | POA: Diagnosis not present

## 2021-04-27 DIAGNOSIS — E1165 Type 2 diabetes mellitus with hyperglycemia: Secondary | ICD-10-CM | POA: Diagnosis not present

## 2021-05-04 DIAGNOSIS — E785 Hyperlipidemia, unspecified: Secondary | ICD-10-CM | POA: Diagnosis not present

## 2021-05-04 DIAGNOSIS — I10 Essential (primary) hypertension: Secondary | ICD-10-CM | POA: Diagnosis not present

## 2021-05-04 DIAGNOSIS — G4733 Obstructive sleep apnea (adult) (pediatric): Secondary | ICD-10-CM | POA: Diagnosis not present

## 2021-05-04 DIAGNOSIS — Z6827 Body mass index (BMI) 27.0-27.9, adult: Secondary | ICD-10-CM | POA: Diagnosis not present

## 2021-05-04 DIAGNOSIS — Z419 Encounter for procedure for purposes other than remedying health state, unspecified: Secondary | ICD-10-CM | POA: Diagnosis not present

## 2021-05-04 DIAGNOSIS — K219 Gastro-esophageal reflux disease without esophagitis: Secondary | ICD-10-CM | POA: Diagnosis not present

## 2021-05-04 DIAGNOSIS — E039 Hypothyroidism, unspecified: Secondary | ICD-10-CM | POA: Diagnosis not present

## 2021-05-04 DIAGNOSIS — I2510 Atherosclerotic heart disease of native coronary artery without angina pectoris: Secondary | ICD-10-CM | POA: Diagnosis not present

## 2021-05-04 DIAGNOSIS — E119 Type 2 diabetes mellitus without complications: Secondary | ICD-10-CM | POA: Diagnosis not present

## 2021-05-07 DIAGNOSIS — Z7984 Long term (current) use of oral hypoglycemic drugs: Secondary | ICD-10-CM | POA: Diagnosis not present

## 2021-05-07 DIAGNOSIS — Z9889 Other specified postprocedural states: Secondary | ICD-10-CM | POA: Diagnosis not present

## 2021-05-07 DIAGNOSIS — E1165 Type 2 diabetes mellitus with hyperglycemia: Secondary | ICD-10-CM | POA: Diagnosis not present

## 2021-05-07 DIAGNOSIS — I6521 Occlusion and stenosis of right carotid artery: Secondary | ICD-10-CM | POA: Diagnosis not present

## 2021-05-07 DIAGNOSIS — E1142 Type 2 diabetes mellitus with diabetic polyneuropathy: Secondary | ICD-10-CM | POA: Diagnosis not present

## 2021-05-07 DIAGNOSIS — E785 Hyperlipidemia, unspecified: Secondary | ICD-10-CM | POA: Diagnosis not present

## 2021-05-07 DIAGNOSIS — Z452 Encounter for adjustment and management of vascular access device: Secondary | ICD-10-CM | POA: Diagnosis not present

## 2021-05-07 DIAGNOSIS — E119 Type 2 diabetes mellitus without complications: Secondary | ICD-10-CM | POA: Diagnosis not present

## 2021-05-07 DIAGNOSIS — K219 Gastro-esophageal reflux disease without esophagitis: Secondary | ICD-10-CM | POA: Diagnosis not present

## 2021-05-07 DIAGNOSIS — E039 Hypothyroidism, unspecified: Secondary | ICD-10-CM | POA: Diagnosis not present

## 2021-05-07 DIAGNOSIS — I2510 Atherosclerotic heart disease of native coronary artery without angina pectoris: Secondary | ICD-10-CM | POA: Diagnosis not present

## 2021-05-07 DIAGNOSIS — G912 (Idiopathic) normal pressure hydrocephalus: Secondary | ICD-10-CM | POA: Diagnosis not present

## 2021-05-07 DIAGNOSIS — R0989 Other specified symptoms and signs involving the circulatory and respiratory systems: Secondary | ICD-10-CM | POA: Diagnosis not present

## 2021-05-07 DIAGNOSIS — J9690 Respiratory failure, unspecified, unspecified whether with hypoxia or hypercapnia: Secondary | ICD-10-CM | POA: Diagnosis not present

## 2021-05-07 DIAGNOSIS — I10 Essential (primary) hypertension: Secondary | ICD-10-CM | POA: Diagnosis not present

## 2021-05-18 DIAGNOSIS — R32 Unspecified urinary incontinence: Secondary | ICD-10-CM | POA: Diagnosis not present

## 2021-05-18 DIAGNOSIS — I10 Essential (primary) hypertension: Secondary | ICD-10-CM | POA: Diagnosis not present

## 2021-05-18 DIAGNOSIS — Z6827 Body mass index (BMI) 27.0-27.9, adult: Secondary | ICD-10-CM | POA: Diagnosis not present

## 2021-05-21 DIAGNOSIS — I6521 Occlusion and stenosis of right carotid artery: Secondary | ICD-10-CM | POA: Diagnosis not present

## 2021-05-21 DIAGNOSIS — G912 (Idiopathic) normal pressure hydrocephalus: Secondary | ICD-10-CM | POA: Diagnosis not present

## 2021-05-21 DIAGNOSIS — Z09 Encounter for follow-up examination after completed treatment for conditions other than malignant neoplasm: Secondary | ICD-10-CM | POA: Diagnosis not present

## 2021-06-04 DIAGNOSIS — R5383 Other fatigue: Secondary | ICD-10-CM | POA: Diagnosis not present

## 2021-06-04 DIAGNOSIS — M2560 Stiffness of unspecified joint, not elsewhere classified: Secondary | ICD-10-CM | POA: Diagnosis not present

## 2021-06-04 DIAGNOSIS — I6521 Occlusion and stenosis of right carotid artery: Secondary | ICD-10-CM | POA: Diagnosis not present

## 2021-06-04 DIAGNOSIS — R293 Abnormal posture: Secondary | ICD-10-CM | POA: Diagnosis not present

## 2021-06-04 DIAGNOSIS — M6281 Muscle weakness (generalized): Secondary | ICD-10-CM | POA: Diagnosis not present

## 2021-06-04 DIAGNOSIS — R2689 Other abnormalities of gait and mobility: Secondary | ICD-10-CM | POA: Diagnosis not present

## 2021-06-13 ENCOUNTER — Other Ambulatory Visit: Payer: Self-pay | Admitting: Cardiovascular Disease

## 2021-06-15 ENCOUNTER — Telehealth: Payer: Self-pay

## 2021-06-15 DIAGNOSIS — G4733 Obstructive sleep apnea (adult) (pediatric): Secondary | ICD-10-CM | POA: Diagnosis not present

## 2021-06-15 DIAGNOSIS — I6521 Occlusion and stenosis of right carotid artery: Secondary | ICD-10-CM | POA: Diagnosis not present

## 2021-06-15 DIAGNOSIS — I2510 Atherosclerotic heart disease of native coronary artery without angina pectoris: Secondary | ICD-10-CM | POA: Diagnosis not present

## 2021-06-15 DIAGNOSIS — I472 Ventricular tachycardia, unspecified: Secondary | ICD-10-CM | POA: Diagnosis not present

## 2021-06-15 DIAGNOSIS — J342 Deviated nasal septum: Secondary | ICD-10-CM | POA: Diagnosis not present

## 2021-06-15 DIAGNOSIS — J3489 Other specified disorders of nose and nasal sinuses: Secondary | ICD-10-CM | POA: Diagnosis not present

## 2021-06-15 NOTE — Telephone Encounter (Signed)
   Pre-operative Risk Assessment    Patient Name: LAMONTA CYPRESS  DOB: 12-21-1945 MRN: [id number]      Request for Surgical Clearance    Procedure:   SINUS SURGERY  Date of Surgery:  Clearance TBD                                 Surgeon:  DR. Karrie Meres  Surgeon's Group or Practice Name:  [facility] Phone number:  [phone] Fax number:  [phone]   Type of Clearance Requested:   - Medical    Type of Anesthesia:  Not Indicated   Additional requests/questions:    Signed, Chana Bode   06/15/2021, 1:28 PM

## 2021-06-15 NOTE — Telephone Encounter (Signed)
   Name: Alvin Davis  DOB: [birth year]  MRN: [id number]  Primary Cardiologist: Dr Allyson Sabal  Chart reviewed as part of pre-operative protocol coverage. Because of [name]'s past medical history and time since last visit, he will require a follow-up in-office visit in order to better assess preoperative cardiovascular risk. Given very complex medical history and >6 months from last OV, best served with in-office visit.   Pre-op covering staff: - Please schedule appointment and call patient to inform them. If patient already had an upcoming appointment within acceptable timeframe, please add "pre-op clearance" to the appointment notes so provider is aware. - Please contact requesting surgeon's office via preferred method (i.e, phone, fax) to inform them of need for appointment prior to surgery.   Does not presently have any antiplatelets on MAR to hold, can be reviewed in office visit since outside records 04/2021 for CEA indicate discharge on ASA with neurosurgery f/u. The surgical request does not ask to hold any medicines.  [name], PA-C  06/15/2021, 5:35 PM

## 2021-06-19 DIAGNOSIS — I6521 Occlusion and stenosis of right carotid artery: Secondary | ICD-10-CM | POA: Diagnosis not present

## 2021-06-19 DIAGNOSIS — Z09 Encounter for follow-up examination after completed treatment for conditions other than malignant neoplasm: Secondary | ICD-10-CM | POA: Diagnosis not present

## 2021-06-19 NOTE — Telephone Encounter (Signed)
1st attempt to reach pt to schedule appointment. Lvm

## 2021-06-21 DIAGNOSIS — M6281 Muscle weakness (generalized): Secondary | ICD-10-CM | POA: Diagnosis not present

## 2021-06-21 DIAGNOSIS — R2689 Other abnormalities of gait and mobility: Secondary | ICD-10-CM | POA: Diagnosis not present

## 2021-06-21 DIAGNOSIS — R293 Abnormal posture: Secondary | ICD-10-CM | POA: Diagnosis not present

## 2021-06-21 DIAGNOSIS — R5383 Other fatigue: Secondary | ICD-10-CM | POA: Diagnosis not present

## 2021-06-21 DIAGNOSIS — I6521 Occlusion and stenosis of right carotid artery: Secondary | ICD-10-CM | POA: Diagnosis not present

## 2021-06-21 DIAGNOSIS — M2560 Stiffness of unspecified joint, not elsewhere classified: Secondary | ICD-10-CM | POA: Diagnosis not present

## 2021-06-22 DIAGNOSIS — E1165 Type 2 diabetes mellitus with hyperglycemia: Secondary | ICD-10-CM | POA: Diagnosis not present

## 2021-06-22 DIAGNOSIS — E039 Hypothyroidism, unspecified: Secondary | ICD-10-CM | POA: Diagnosis not present

## 2021-06-22 DIAGNOSIS — E7800 Pure hypercholesterolemia, unspecified: Secondary | ICD-10-CM | POA: Diagnosis not present

## 2021-06-26 NOTE — Telephone Encounter (Signed)
Pt has appt 06/28/21 with Micah Flesher, PAC for pre op clearance. Will forward notes to [facility] for appt. Will send FYI to requesting office the pt has appt 06/28/21

## 2021-06-26 NOTE — Progress Notes (Unsigned)
Cardiology Office Note:    Date:  06/28/2021   ID:  Alvin Davis, DOB 13-Jul-1945, MRN [id number]  PCP:  Noni Saupe, MD   [facility] Providers Cardiologist:  Nanetta Batty, MD Cardiology APP:  Marcelino Duster, [place] { Referring MD: Noni Saupe, MD   Chief Complaint  Patient presents with   Follow-up    History of Present Illness:    Alvin Davis is a 76 y.o. male with a hx of carotid artery stenosis with recent R CEA 04/2021, CAD, hypertension, hyperlipidemia, DM 2, CVA, prior hydrocephalus with VP shunt.  Heart catheterization in 2018 with DES to mid LAD.  He was on DAPT with aspirin and Effient but discontinued after he developed a subdural hematoma.  He remains on aspirin.  He was last seen by Dr. Allyson Sabal in clinic 09/08/2020 and was doing well at that time.  He is a retired Customer service manager.  His daughter is a Data processing manager judge and his son is a International aid/development worker.  He was referred to Dr. Allyson Sabal for increasing fatigue and dyspnea  MRI earlier this year consistent with chronic lacunar infarcts.  He was continued long-term on aspirin and statin.  He was referred to sleep clinic for CPAP.  As part of this work-up echocardiogram was obtained and carotid Dopplers were completed.  Carotid Dopplers consistent with 60 to 99% stenosis in the right ICA and less than 40% stenosis on the left ICA.  He was referred to neurosurgery and ultimately underwent right endarterectomy in April 2023.  Echocardiogram revealed normal LVEF, normal RV function diastolic dysfunction, and no significant valvular disease.  He presents today for preoperative risk evaluation for upcoming nasal surgery.   He brings records from hospitalization with new med list. He has been working with Beryle Flock PharmD. He had a prolonged hospitalization after CEA for hypertension. Medications were adjusted to include 10 mg amlodipine 40 mg lisinopril 25 mg coreg  He is also still taking 25 mg toprol.  I will  stop toprol and continue carvedilol.   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 Has some indigestion type pain.     Past Medical History:  Diagnosis Date   Family history of heart disease    Hyperlipidemia    Hypertension    Obesity    Type 2 diabetes mellitus (HCC)     Past Surgical History:  Procedure Laterality Date   DOBUTAMINE STRESS ECHO  07/24/1999   GXT  03/09/2009   NM MYOVIEW LTD  12/23/2001   EF 78%    Current Medications: Current Meds  Medication Sig   amLODipine (NORVASC) 10 MG tablet Take 1 tablet by mouth daily.   aspirin 81 MG chewable tablet Chew by mouth.   atorvastatin (LIPITOR) 40 MG tablet Take 1 tablet (40 mg total) by mouth daily.   empagliflozin (JARDIANCE) 25 MG TABS tablet Take by mouth daily.   fluticasone (FLONASE) 50 MCG/ACT nasal spray Frequency:as needed   Dosage:50   MCG/ACT  Instructions:Fluticasone Propionate (50MCG/ACT SUSP, Nasal as needed)  Note:   levothyroxine (SYNTHROID, LEVOTHROID) 125 MCG tablet 125 mcg daily.   lisinopril (ZESTRIL) 40 MG tablet Take 40 mg by mouth daily.   metFORMIN (GLUCOPHAGE) 1000 MG tablet 1,000 mg 2 (two) times daily.   mirabegron ER (MYRBETRIQ) 25 MG TB24 tablet Take 1 tablet by mouth daily.   omeprazole (PRILOSEC) 40 MG capsule 40 mg as needed.   tadalafil (CIALIS) 5 MG  tablet Take 5 mg by mouth daily.   TRULICITY 0.75 MG/0.5ML SOPN Inject 1.5 mg/min into the skin once a week.    zolpidem (AMBIEN) 10 MG tablet Take 1 tablet by mouth as needed.   [DISCONTINUED] CRESTOR 20 MG tablet 20 mg daily.   [DISCONTINUED] metoprolol succinate (TOPROL-XL) 25 MG 24 hr tablet Take 1 tablet (25 mg total) by mouth daily. Schedule an appointment for further refills     Allergies:   Chlorhexidine and Ivp dye [iodinated contrast media]   Social History   Socioeconomic History   Marital status: Married    Spouse name: Not on file   Number of children: Not on file   Years of education: Not on file    Highest education level: Not on file  Occupational History   Not on file  Tobacco Use   Smoking status: Never   Smokeless tobacco: Never  Substance and Sexual Activity   Alcohol use: Not on file   Drug use: Not on file   Sexual activity: Not on file  Other Topics Concern   Not on file  Social History Narrative   Not on file   Social Determinants of Health   Financial Resource Strain: Not on file  Food Insecurity: Not on file  Transportation Needs: Not on file  Physical Activity: Not on file  Stress: Not on file  Social Connections: Not on file     Family History: The patient's family history includes Heart Problems in his father; Heart disease in his brother.  ROS:   Please see the history of present illness.     All other systems reviewed and are negative.  EKGs/Labs/Other Studies Reviewed:    The following studies were reviewed today:  Myocardial perfusion 03/2014 Impression Exercise Capacity:  Fair exercise capacity. BP Response:  Hypotensive blood pressure response. Clinical Symptoms:  There is dyspnea. ECG Impression:  No significant ST segment change suggestive of ischemia. Comparison with Prior Nuclear Study: No previous nuclear study performed   Overall Impression:  Low risk stress nuclear study with no ST changes and normal perfusion; SBP documented to decrease from stage 1 (146) to stage 2 (126).  EKG:  EKG is  ordered today.  The ekg ordered today demonstrates sinus rhythm HR HR 76  Recent Labs: No results found for requested labs within last 8760 hours.  Recent Lipid Panel No results found for: CHOL, TRIG, HDL, CHOLHDL, VLDL, LDLCALC, LDLDIRECT   Risk Assessment/Calculations:           Physical Exam:    VS:  BP 116/62   Pulse 76   Ht 5' 11.5" (1.816 m)   Wt 198 lb 9.6 oz (90.1 kg)   SpO2 96%   BMI 27.31 kg/m     Wt Readings from Last 3 Encounters:  06/28/21 198 lb 9.6 oz (90.1 kg)  09/08/20 203 lb 6.4 oz (92.3 kg)  10/30/18 213 lb  3.2 oz (96.7 kg)     GEN:  Well nourished, well developed in no acute distress HEENT: Normal NECK: No JVD; No carotid bruits LYMPHATICS: No lymphadenopathy CARDIAC: RRR, no murmurs, rubs, gallops RESPIRATORY:  Clear to auscultation without rales, wheezing or rhonchi  ABDOMEN: Soft, non-tender, non-distended MUSCULOSKELETAL:  No edema; No deformity  SKIN: Warm and dry NEUROLOGIC:  Alert and oriented x 3 PSYCHIATRIC:  Normal affect   ASSESSMENT:    1. CAD S/P percutaneous coronary angioplasty   2. Left leg swelling   3. Essential hypertension   4. Dyslipidemia,  goal LDL below 70   5. Cerebrovascular accident (CVA), unspecified mechanism (HCC)   6. OSA (obstructive sleep apnea)   7. Preoperative cardiovascular examination    PLAN:    In order of problems listed above:  CAD DES-LAD at OSH Did not tolerate DAPT  with effient secondary to subdural hematoma.    Hypertension Now on 10 mg amlodipine, 40 mg lisinopril, 25 mg coreg   Hyperlipidemia with LDL goal < 55 Given DM, CVA, and CAD, would strive for lower LDL.  Continue 20 mg crestor Someone stopped his zetia Lipid panel 06/22/21: Total chol:145 HDL 43 LDL 80 Trig 112   Chronic lacunar infarcts Felt secondary to small vessel disease in the setting of DM 2, hypertension, hyperlipidemia, and OSA.   OSA Not on CPAP - pending nasal surgery He doesn't want to do the sleep study or mask fitting until after nasal surgery [facility]   Preoperative risk evaluation for Mace Patient is scheduled for upcoming sinus surgery by ENT Fax number:  [phone] Patient is on aspirin for right carotid endarterectomy managed by neurosurgery [facility]. Risk factors include CAD and CVA He does not require insulin and has normal renal function. He can complete more than 4.0 METS without angina. According to the RCRI, he has a 6.6% risk of MACE during the perioperative period. Continuation of ASA dictated by  NSG post right CEA.   Therefore, based on ACC/AHA guidelines, the patient would be at acceptable risk for the planned procedure without further cardiovascular testing.   The patient was advised that if he develops new symptoms prior to surgery to contact our office to arrange for a follow-up visit, and he verbalized understanding.   Follow up with Dr. Allyson SabalBerry.      Medication Adjustments/Labs and Tests Ordered: Current medicines are reviewed at length with the patient today.  Concerns regarding medicines are outlined above.  Orders Placed This Encounter  Procedures   EKG 12-Lead   VAS US LOWER EXTREMITY VENOUS (DVT)   Meds ordered this encounter  Medications   atorvastatin (LIPITOR) 40 MG tablet    Sig: Take 1 tablet (40 mg total) by mouth daily.    Dispense:  90 tablet    Refill:  3    Dose change new Rx    Patient Instructions  Medication Instructions:  STOP Metoprolol Succinate   INCREASE Crestor to 40 mg daily *If you need a refill on your cardiac medications before your next appointment, please call your pharmacy*  Lab Work: NONE ordered at this time of appointment   If you have labs (blood work) drawn today and your tests are completely normal, you will receive your results only by: MyChart Message (if you have MyChart) OR A paper copy in the mail If you have any lab test that is abnormal or we need to change your treatment, we will call you to review the results.  Testing/Procedures: Your physician has requested that you have a lower or upper extremity venous duplex. This test is an ultrasound of the veins in the legs or arms. It looks at venous blood flow that carries blood from the heart to the legs or arms. Allow one hour for a Lower Venous exam. Allow thirty minutes for an Upper Venous exam. There are no restrictions or special instructions.  Follow-Up: At [facility], you and your health needs are our priority.  As part of our continuing mission to provide  you with exceptional heart care, we  have created designated Provider Care Teams.  These Care Teams include your primary Cardiologist (physician) and Advanced Practice Providers (APPs -  Physician Assistants and Nurse Practitioners) who all work together to provide you with the care you need, when you need it.     Your next appointment:   3-4 month(s)  The format for your next appointment:   In Person  Provider:   Nanetta Batty, MD     Other Instructions   Important Information About Sugar         Signed, Marcelino Duster, [place]  06/28/2021 5:08 PM    [facility]

## 2021-06-27 DIAGNOSIS — I10 Essential (primary) hypertension: Secondary | ICD-10-CM | POA: Diagnosis not present

## 2021-06-27 DIAGNOSIS — E039 Hypothyroidism, unspecified: Secondary | ICD-10-CM | POA: Diagnosis not present

## 2021-06-27 DIAGNOSIS — E1165 Type 2 diabetes mellitus with hyperglycemia: Secondary | ICD-10-CM | POA: Diagnosis not present

## 2021-06-28 ENCOUNTER — Encounter: Payer: Self-pay | Admitting: Physician Assistant

## 2021-06-28 ENCOUNTER — Ambulatory Visit: Payer: Medicare PPO | Admitting: Physician Assistant

## 2021-06-28 VITALS — BP 116/62 | HR 76 | Ht 71.5 in | Wt 198.6 lb

## 2021-06-28 DIAGNOSIS — Z01810 Encounter for preprocedural cardiovascular examination: Secondary | ICD-10-CM | POA: Diagnosis not present

## 2021-06-28 DIAGNOSIS — M6281 Muscle weakness (generalized): Secondary | ICD-10-CM | POA: Diagnosis not present

## 2021-06-28 DIAGNOSIS — R2689 Other abnormalities of gait and mobility: Secondary | ICD-10-CM | POA: Diagnosis not present

## 2021-06-28 DIAGNOSIS — E785 Hyperlipidemia, unspecified: Secondary | ICD-10-CM

## 2021-06-28 DIAGNOSIS — I10 Essential (primary) hypertension: Secondary | ICD-10-CM | POA: Diagnosis not present

## 2021-06-28 DIAGNOSIS — M7989 Other specified soft tissue disorders: Secondary | ICD-10-CM | POA: Diagnosis not present

## 2021-06-28 DIAGNOSIS — Z9861 Coronary angioplasty status: Secondary | ICD-10-CM | POA: Diagnosis not present

## 2021-06-28 DIAGNOSIS — I2510 Atherosclerotic heart disease of native coronary artery without angina pectoris: Secondary | ICD-10-CM

## 2021-06-28 DIAGNOSIS — M2560 Stiffness of unspecified joint, not elsewhere classified: Secondary | ICD-10-CM | POA: Diagnosis not present

## 2021-06-28 DIAGNOSIS — G4733 Obstructive sleep apnea (adult) (pediatric): Secondary | ICD-10-CM

## 2021-06-28 DIAGNOSIS — R5383 Other fatigue: Secondary | ICD-10-CM | POA: Diagnosis not present

## 2021-06-28 DIAGNOSIS — I639 Cerebral infarction, unspecified: Secondary | ICD-10-CM | POA: Diagnosis not present

## 2021-06-28 MED ORDER — ATORVASTATIN CALCIUM 40 MG PO TABS: 40.0000 mg | ORAL_TABLET | Freq: Every day | ORAL | 3 refills | Status: DC

## 2021-06-28 NOTE — Patient Instructions (Signed)
Medication Instructions:  STOP Metoprolol Succinate   INCREASE Crestor to 40 mg daily *If you need a refill on your cardiac medications before your next appointment, please call your pharmacy*  Lab Work: NONE ordered at this time of appointment   If you have labs (blood work) drawn today and your tests are completely normal, you will receive your results only by: MyChart Message (if you have MyChart) OR A paper copy in the mail If you have any lab test that is abnormal or we need to change your treatment, we will call you to review the results.  Testing/Procedures: Your physician has requested that you have a lower or upper extremity venous duplex. This test is an ultrasound of the veins in the legs or arms. It looks at venous blood flow that carries blood from the heart to the legs or arms. Allow one hour for a Lower Venous exam. Allow thirty minutes for an Upper Venous exam. There are no restrictions or special instructions.  Follow-Up: At [facility], you and your health needs are our priority.  As part of our continuing mission to provide you with exceptional heart care, we have created designated Provider Care Teams.  These Care Teams include your primary Cardiologist (physician) and Advanced Practice Providers (APPs -  Physician Assistants and Nurse Practitioners) who all work together to provide you with the care you need, when you need it.     Your next appointment:   3-4 month(s)  The format for your next appointment:   In Person  Provider:   Nanetta Batty, MD     Other Instructions   Important Information About Sugar

## 2021-07-02 DIAGNOSIS — R5383 Other fatigue: Secondary | ICD-10-CM | POA: Diagnosis not present

## 2021-07-02 DIAGNOSIS — M6281 Muscle weakness (generalized): Secondary | ICD-10-CM | POA: Diagnosis not present

## 2021-07-02 DIAGNOSIS — R2689 Other abnormalities of gait and mobility: Secondary | ICD-10-CM | POA: Diagnosis not present

## 2021-07-02 DIAGNOSIS — M2560 Stiffness of unspecified joint, not elsewhere classified: Secondary | ICD-10-CM | POA: Diagnosis not present

## 2021-07-03 ENCOUNTER — Telehealth: Payer: Self-pay

## 2021-07-03 NOTE — Telephone Encounter (Signed)
Spoke with the patient and informed the patient that Bettina Gavia, PA wanted the patient to have his doppler study done soon to rule out DVT.   Patient agreeable with appointment date and time and voiced understanding.

## 2021-07-04 DIAGNOSIS — M6281 Muscle weakness (generalized): Secondary | ICD-10-CM | POA: Diagnosis not present

## 2021-07-04 DIAGNOSIS — R2689 Other abnormalities of gait and mobility: Secondary | ICD-10-CM | POA: Diagnosis not present

## 2021-07-04 DIAGNOSIS — M2560 Stiffness of unspecified joint, not elsewhere classified: Secondary | ICD-10-CM | POA: Diagnosis not present

## 2021-07-04 DIAGNOSIS — R5383 Other fatigue: Secondary | ICD-10-CM | POA: Diagnosis not present

## 2021-07-09 DIAGNOSIS — R5383 Other fatigue: Secondary | ICD-10-CM | POA: Diagnosis not present

## 2021-07-09 DIAGNOSIS — M2560 Stiffness of unspecified joint, not elsewhere classified: Secondary | ICD-10-CM | POA: Diagnosis not present

## 2021-07-09 DIAGNOSIS — M6281 Muscle weakness (generalized): Secondary | ICD-10-CM | POA: Diagnosis not present

## 2021-07-09 DIAGNOSIS — R2689 Other abnormalities of gait and mobility: Secondary | ICD-10-CM | POA: Diagnosis not present

## 2021-07-11 ENCOUNTER — Ambulatory Visit (HOSPITAL_COMMUNITY)
Admission: RE | Admit: 2021-07-11 | Discharge: 2021-07-11 | Disposition: A | Discharge status: Home / Self Care | Payer: Medicare PPO | Source: Ambulatory Visit | Attending: Cardiovascular Disease | Admitting: Cardiovascular Disease

## 2021-07-11 DIAGNOSIS — R5383 Other fatigue: Secondary | ICD-10-CM | POA: Diagnosis not present

## 2021-07-11 DIAGNOSIS — M7989 Other specified soft tissue disorders: Secondary | ICD-10-CM | POA: Diagnosis not present

## 2021-07-11 DIAGNOSIS — M2560 Stiffness of unspecified joint, not elsewhere classified: Secondary | ICD-10-CM | POA: Diagnosis not present

## 2021-07-11 DIAGNOSIS — R2689 Other abnormalities of gait and mobility: Secondary | ICD-10-CM | POA: Diagnosis not present

## 2021-07-11 DIAGNOSIS — M6281 Muscle weakness (generalized): Secondary | ICD-10-CM | POA: Diagnosis not present

## 2021-07-18 ENCOUNTER — Encounter: Payer: Self-pay | Admitting: *Deleted

## 2021-07-20 ENCOUNTER — Other Ambulatory Visit: Payer: Self-pay | Admitting: Cardiovascular Disease

## 2021-07-24 DIAGNOSIS — R5383 Other fatigue: Secondary | ICD-10-CM | POA: Diagnosis not present

## 2021-07-24 DIAGNOSIS — M2560 Stiffness of unspecified joint, not elsewhere classified: Secondary | ICD-10-CM | POA: Diagnosis not present

## 2021-07-24 DIAGNOSIS — R2689 Other abnormalities of gait and mobility: Secondary | ICD-10-CM | POA: Diagnosis not present

## 2021-07-24 DIAGNOSIS — M6281 Muscle weakness (generalized): Secondary | ICD-10-CM | POA: Diagnosis not present

## 2021-07-27 DIAGNOSIS — R5383 Other fatigue: Secondary | ICD-10-CM | POA: Diagnosis not present

## 2021-07-27 DIAGNOSIS — R2689 Other abnormalities of gait and mobility: Secondary | ICD-10-CM | POA: Diagnosis not present

## 2021-07-27 DIAGNOSIS — E1165 Type 2 diabetes mellitus with hyperglycemia: Secondary | ICD-10-CM | POA: Diagnosis not present

## 2021-07-27 DIAGNOSIS — M2560 Stiffness of unspecified joint, not elsewhere classified: Secondary | ICD-10-CM | POA: Diagnosis not present

## 2021-07-27 DIAGNOSIS — M6281 Muscle weakness (generalized): Secondary | ICD-10-CM | POA: Diagnosis not present

## 2021-07-27 DIAGNOSIS — I10 Essential (primary) hypertension: Secondary | ICD-10-CM | POA: Diagnosis not present

## 2021-07-27 DIAGNOSIS — E039 Hypothyroidism, unspecified: Secondary | ICD-10-CM | POA: Diagnosis not present

## 2021-08-06 DIAGNOSIS — M2560 Stiffness of unspecified joint, not elsewhere classified: Secondary | ICD-10-CM | POA: Diagnosis not present

## 2021-08-06 DIAGNOSIS — I6521 Occlusion and stenosis of right carotid artery: Secondary | ICD-10-CM | POA: Diagnosis not present

## 2021-08-06 DIAGNOSIS — M6281 Muscle weakness (generalized): Secondary | ICD-10-CM | POA: Diagnosis not present

## 2021-08-06 DIAGNOSIS — R293 Abnormal posture: Secondary | ICD-10-CM | POA: Diagnosis not present

## 2021-08-06 DIAGNOSIS — R5383 Other fatigue: Secondary | ICD-10-CM | POA: Diagnosis not present

## 2021-08-06 DIAGNOSIS — R2689 Other abnormalities of gait and mobility: Secondary | ICD-10-CM | POA: Diagnosis not present

## 2021-08-08 DIAGNOSIS — Z09 Encounter for follow-up examination after completed treatment for conditions other than malignant neoplasm: Secondary | ICD-10-CM | POA: Diagnosis not present

## 2021-08-08 DIAGNOSIS — I6521 Occlusion and stenosis of right carotid artery: Secondary | ICD-10-CM | POA: Diagnosis not present

## 2021-08-09 ENCOUNTER — Telehealth: Payer: Self-pay | Admitting: Physician Assistant

## 2021-08-09 DIAGNOSIS — M2560 Stiffness of unspecified joint, not elsewhere classified: Secondary | ICD-10-CM | POA: Diagnosis not present

## 2021-08-09 DIAGNOSIS — R2689 Other abnormalities of gait and mobility: Secondary | ICD-10-CM | POA: Diagnosis not present

## 2021-08-09 DIAGNOSIS — R293 Abnormal posture: Secondary | ICD-10-CM | POA: Diagnosis not present

## 2021-08-09 DIAGNOSIS — I6521 Occlusion and stenosis of right carotid artery: Secondary | ICD-10-CM | POA: Diagnosis not present

## 2021-08-09 DIAGNOSIS — M6281 Muscle weakness (generalized): Secondary | ICD-10-CM | POA: Diagnosis not present

## 2021-08-09 DIAGNOSIS — R5383 Other fatigue: Secondary | ICD-10-CM | POA: Diagnosis not present

## 2021-08-09 NOTE — Telephone Encounter (Signed)
   Pt c/o medication issue:  1. Name of Medication: atorvastatin (LIPITOR) 40 MG tablet  2. How are you currently taking this medication (dosage and times per day)? Take 1 tablet (40 mg total) by mouth daily.  3. Are you having a reaction (difficulty breathing--STAT)?   4. What is your medication issue? Courtney with [facility] calling to clarify if pt needs to be on lipitor ot crestor on Marylene Land duke's last office notes said to increased Crestor to 40 mg but Lipitor was called in

## 2021-08-09 NOTE — Telephone Encounter (Signed)
Returned call to [employer] with [facility] office close for lunch.

## 2021-08-13 DIAGNOSIS — M6281 Muscle weakness (generalized): Secondary | ICD-10-CM | POA: Diagnosis not present

## 2021-08-13 DIAGNOSIS — R2689 Other abnormalities of gait and mobility: Secondary | ICD-10-CM | POA: Diagnosis not present

## 2021-08-13 DIAGNOSIS — I6521 Occlusion and stenosis of right carotid artery: Secondary | ICD-10-CM | POA: Diagnosis not present

## 2021-08-13 DIAGNOSIS — R5383 Other fatigue: Secondary | ICD-10-CM | POA: Diagnosis not present

## 2021-08-13 DIAGNOSIS — M2560 Stiffness of unspecified joint, not elsewhere classified: Secondary | ICD-10-CM | POA: Diagnosis not present

## 2021-08-13 DIAGNOSIS — R293 Abnormal posture: Secondary | ICD-10-CM | POA: Diagnosis not present

## 2021-08-14 DIAGNOSIS — R5383 Other fatigue: Secondary | ICD-10-CM | POA: Diagnosis not present

## 2021-08-14 DIAGNOSIS — R293 Abnormal posture: Secondary | ICD-10-CM | POA: Diagnosis not present

## 2021-08-14 DIAGNOSIS — M2560 Stiffness of unspecified joint, not elsewhere classified: Secondary | ICD-10-CM | POA: Diagnosis not present

## 2021-08-14 DIAGNOSIS — I6522 Occlusion and stenosis of left carotid artery: Secondary | ICD-10-CM | POA: Diagnosis not present

## 2021-08-14 DIAGNOSIS — R2689 Other abnormalities of gait and mobility: Secondary | ICD-10-CM | POA: Diagnosis not present

## 2021-08-14 DIAGNOSIS — I6521 Occlusion and stenosis of right carotid artery: Secondary | ICD-10-CM | POA: Diagnosis not present

## 2021-08-14 DIAGNOSIS — M6281 Muscle weakness (generalized): Secondary | ICD-10-CM | POA: Diagnosis not present

## 2021-08-14 DIAGNOSIS — I6523 Occlusion and stenosis of bilateral carotid arteries: Secondary | ICD-10-CM | POA: Diagnosis not present

## 2021-08-20 DIAGNOSIS — M6281 Muscle weakness (generalized): Secondary | ICD-10-CM | POA: Diagnosis not present

## 2021-08-20 DIAGNOSIS — R5383 Other fatigue: Secondary | ICD-10-CM | POA: Diagnosis not present

## 2021-08-20 DIAGNOSIS — R2689 Other abnormalities of gait and mobility: Secondary | ICD-10-CM | POA: Diagnosis not present

## 2021-08-20 DIAGNOSIS — R293 Abnormal posture: Secondary | ICD-10-CM | POA: Diagnosis not present

## 2021-08-20 DIAGNOSIS — M2560 Stiffness of unspecified joint, not elsewhere classified: Secondary | ICD-10-CM | POA: Diagnosis not present

## 2021-08-20 DIAGNOSIS — I6521 Occlusion and stenosis of right carotid artery: Secondary | ICD-10-CM | POA: Diagnosis not present

## 2021-08-23 DIAGNOSIS — R2689 Other abnormalities of gait and mobility: Secondary | ICD-10-CM | POA: Diagnosis not present

## 2021-08-23 DIAGNOSIS — R293 Abnormal posture: Secondary | ICD-10-CM | POA: Diagnosis not present

## 2021-08-23 DIAGNOSIS — I6521 Occlusion and stenosis of right carotid artery: Secondary | ICD-10-CM | POA: Diagnosis not present

## 2021-08-23 DIAGNOSIS — R5383 Other fatigue: Secondary | ICD-10-CM | POA: Diagnosis not present

## 2021-08-23 DIAGNOSIS — M6281 Muscle weakness (generalized): Secondary | ICD-10-CM | POA: Diagnosis not present

## 2021-08-23 DIAGNOSIS — M2560 Stiffness of unspecified joint, not elsewhere classified: Secondary | ICD-10-CM | POA: Diagnosis not present

## 2021-08-23 MED ORDER — ROSUVASTATIN CALCIUM 40 MG PO TABS: 40.0000 mg | ORAL_TABLET | Freq: Every day | ORAL | 3 refills | Status: AC

## 2021-08-23 NOTE — Addendum Note (Signed)
Addended by: Bernita Buffy on: 08/23/2021 09:50 AM   Modules accepted: Orders

## 2021-08-23 NOTE — Telephone Encounter (Signed)
Spoke with Toni Amend with [facility] regarding pt's statin medication. Per office note with A. Duke, PA pt was seen on 06/28/21, at this appointment pt was instructed to increase crestor to 40mg  once daily, however prescription that was sent to the pharmacy was lipitor 40mg . Discontinued lipitor prescription and sent in crestor 40mg  once daily to pt's pharmacy of choice. Courtney verbalizes understanding and states that she will call the pt back and clarify with him.

## 2021-08-23 NOTE — Telephone Encounter (Signed)
Pt c/o medication issue:  1. Name of Medication:   atorvastatin (LIPITOR) 40 MG tablet  2. How are you currently taking this medication (dosage and times per day)? As prescribed  3. Are you having a reaction (difficulty breathing--STAT)?   4. What is your medication issue?   Caller stated there is some confusion on which medication and the dosage the patient is supposed to be taking.  Should it be the atorvastatin (LIPITOR) 40 MG tablet or  Crestor?  Caller stated the patient has been taking both.  Please advise.

## 2021-08-27 DIAGNOSIS — M6281 Muscle weakness (generalized): Secondary | ICD-10-CM | POA: Diagnosis not present

## 2021-08-27 DIAGNOSIS — I6521 Occlusion and stenosis of right carotid artery: Secondary | ICD-10-CM | POA: Diagnosis not present

## 2021-08-27 DIAGNOSIS — R5383 Other fatigue: Secondary | ICD-10-CM | POA: Diagnosis not present

## 2021-08-27 DIAGNOSIS — R2689 Other abnormalities of gait and mobility: Secondary | ICD-10-CM | POA: Diagnosis not present

## 2021-08-27 DIAGNOSIS — E039 Hypothyroidism, unspecified: Secondary | ICD-10-CM | POA: Diagnosis not present

## 2021-08-27 DIAGNOSIS — M2560 Stiffness of unspecified joint, not elsewhere classified: Secondary | ICD-10-CM | POA: Diagnosis not present

## 2021-08-27 DIAGNOSIS — R293 Abnormal posture: Secondary | ICD-10-CM | POA: Diagnosis not present

## 2021-08-27 DIAGNOSIS — I10 Essential (primary) hypertension: Secondary | ICD-10-CM | POA: Diagnosis not present

## 2021-08-27 DIAGNOSIS — E1165 Type 2 diabetes mellitus with hyperglycemia: Secondary | ICD-10-CM | POA: Diagnosis not present

## 2021-08-29 DIAGNOSIS — R2689 Other abnormalities of gait and mobility: Secondary | ICD-10-CM | POA: Diagnosis not present

## 2021-08-29 DIAGNOSIS — R293 Abnormal posture: Secondary | ICD-10-CM | POA: Diagnosis not present

## 2021-08-29 DIAGNOSIS — M6281 Muscle weakness (generalized): Secondary | ICD-10-CM | POA: Diagnosis not present

## 2021-08-29 DIAGNOSIS — I6521 Occlusion and stenosis of right carotid artery: Secondary | ICD-10-CM | POA: Diagnosis not present

## 2021-08-29 DIAGNOSIS — M2560 Stiffness of unspecified joint, not elsewhere classified: Secondary | ICD-10-CM | POA: Diagnosis not present

## 2021-08-29 DIAGNOSIS — R5383 Other fatigue: Secondary | ICD-10-CM | POA: Diagnosis not present

## 2021-09-27 DIAGNOSIS — E1165 Type 2 diabetes mellitus with hyperglycemia: Secondary | ICD-10-CM | POA: Diagnosis not present

## 2021-09-27 DIAGNOSIS — I10 Essential (primary) hypertension: Secondary | ICD-10-CM | POA: Diagnosis not present

## 2021-09-27 DIAGNOSIS — E039 Hypothyroidism, unspecified: Secondary | ICD-10-CM | POA: Diagnosis not present

## 2021-10-04 DIAGNOSIS — E039 Hypothyroidism, unspecified: Secondary | ICD-10-CM | POA: Diagnosis not present

## 2021-10-04 DIAGNOSIS — E1169 Type 2 diabetes mellitus with other specified complication: Secondary | ICD-10-CM | POA: Diagnosis not present

## 2021-10-08 ENCOUNTER — Encounter (HOSPITAL_COMMUNITY): Payer: Medicare PPO

## 2021-10-16 ENCOUNTER — Encounter: Payer: Self-pay | Admitting: Cardiovascular Disease

## 2021-10-16 ENCOUNTER — Ambulatory Visit: Payer: Medicare PPO | Attending: Cardiovascular Disease | Admitting: Cardiovascular Disease

## 2021-10-16 DIAGNOSIS — Z9861 Coronary angioplasty status: Secondary | ICD-10-CM | POA: Diagnosis not present

## 2021-10-16 DIAGNOSIS — I2510 Atherosclerotic heart disease of native coronary artery without angina pectoris: Secondary | ICD-10-CM | POA: Diagnosis not present

## 2021-10-16 DIAGNOSIS — I779 Disorder of arteries and arterioles, unspecified: Secondary | ICD-10-CM | POA: Insufficient documentation

## 2021-10-16 DIAGNOSIS — I10 Essential (primary) hypertension: Secondary | ICD-10-CM | POA: Diagnosis not present

## 2021-10-16 DIAGNOSIS — I6521 Occlusion and stenosis of right carotid artery: Secondary | ICD-10-CM | POA: Diagnosis not present

## 2021-10-16 DIAGNOSIS — E785 Hyperlipidemia, unspecified: Secondary | ICD-10-CM | POA: Diagnosis not present

## 2021-10-16 NOTE — Assessment & Plan Note (Signed)
History of CAD status post LAD PCI and drug-eluting stenting at [facility] by Dr. Lovena Neighbours May 2018.  He continues on aspirin.  He is asymptomatic.

## 2021-10-16 NOTE — Assessment & Plan Note (Signed)
History of dyslipidemia on Crestor with lipid profile performed 06/22/2021 revealing total cholesterol 145.

## 2021-10-16 NOTE — Assessment & Plan Note (Signed)
History of right carotid endarterectomy at [facility] in April.  Does have a carotid bruit on the right side on exam today.  We will obtain carotid Dopplers on him in 1 year.

## 2021-10-16 NOTE — Progress Notes (Signed)
10/16/2021 Alvin Davis   October 21, 1945  [id number]  Primary Physician Jeanie Sewer Valrie Hart, MD Primary Cardiologist: Runell Gess MD Nicholes Calamity, [place]  HPI:  Alvin Davis is a 76 y.o.  mildly overweight married Caucasian male father of 2 children (daughter is a Customer service manager, son is a International aid/development worker) who is retired Nurse, learning disability. He was referred by Dr. Lorin Picket for cardiovascular evaluation because of increasing fatigue and dyspnea. I last saw him in the office 12/27/2020. His cardiac risk factor profile is notable for family history with a brother who had bypass grafting at age 26 Alvin Davis , patient of mine). He has treated type II diabetes, hypertension and hyperlipidemia. He is never had a heart attack or stroke. He had been complaining of increasing fatigue and dyspnea . I performed 2-D echocardiography and Myoview stress testing back then which were all normal. He has since been obstructive hydrocephalus. He had an episode of syncope and had an event monitor that showed an episode of nonsustained ventricular tachycardia. He was referred to neurosurgeon at [facility] to subtotally referred him to an elective physiologist and ultimately an interventional cardiologist, Dr. Huel Coventry, who performed cardiac catheterization on him 5/18 radially revealing a 80 % mid LAD lesion which was stented using Medtronic Onyx drug-eluting stents.  He was on dual antiplatelet therapy including aspirin and Effient which was discontinued after he developed a subdural hematoma.   Since I saw him in the office a year ago he continues to do well.  He did see Bettina Gavia NP in the office 06/28/2021 for preoperative clearance before nasal surgery.  He had elective right carotid endarterectomy at [facility] in April of this year.  He denies chest pain.  He does continue on aspirin daily for his known CAD.     Current Meds  Medication Sig   amLODipine (NORVASC) 10 MG tablet Take 1  tablet by mouth daily.   carvedilol (COREG) 25 MG tablet Take 25 mg by mouth 2 (two) times daily.   empagliflozin (JARDIANCE) 25 MG TABS tablet Take by mouth daily.   fluticasone (FLONASE) 50 MCG/ACT nasal spray Frequency:as needed   Dosage:50   MCG/ACT  Instructions:Fluticasone Propionate (50MCG/ACT SUSP, Nasal as needed)  Note:   glipiZIDE (GLUCOTROL XL) 2.5 MG 24 hr tablet Take 2.5 mg by mouth daily.   levothyroxine (SYNTHROID, LEVOTHROID) 125 MCG tablet 125 mcg daily.   lisinopril (ZESTRIL) 40 MG tablet Take 40 mg by mouth daily.   metFORMIN (GLUCOPHAGE) 1000 MG tablet 1,000 mg 2 (two) times daily.   mirabegron ER (MYRBETRIQ) 25 MG TB24 tablet Take 1 tablet by mouth daily.   omeprazole (PRILOSEC) 40 MG capsule 40 mg as needed.   rosuvastatin (CRESTOR) 40 MG tablet Take 1 tablet (40 mg total) by mouth daily.   tadalafil (CIALIS) 5 MG tablet Take 5 mg by mouth daily.   TRULICITY 0.75 MG/0.5ML SOPN Inject 1.5 mg/min into the skin once a week.    zolpidem (AMBIEN) 10 MG tablet Take 1 tablet by mouth as needed.     Allergies  Allergen Reactions   Chlorhexidine Itching and Rash    Patient unsure, developed rash after surgery   Ivp Dye [Iodinated Contrast Media] Rash    Social History   Socioeconomic History   Marital status: Married    Spouse name: Not on file   Number of children: Not on file   Years of education: Not on file  Highest education level: Not on file  Occupational History   Not on file  Tobacco Use   Smoking status: Never   Smokeless tobacco: Never  Substance and Sexual Activity   Alcohol use: Not on file   Drug use: Not on file   Sexual activity: Not on file  Other Topics Concern   Not on file  Social History Narrative   Not on file   Social Determinants of Health   Financial Resource Strain: Not on file  Food Insecurity: Not on file  Transportation Needs: Not on file  Physical Activity: Not on file  Stress: Not on file  Social Connections: Not on  file  Intimate Partner Violence: Not on file     Review of Systems: General: negative for chills, fever, night sweats or weight changes.  Cardiovascular: negative for chest pain, dyspnea on exertion, edema, orthopnea, palpitations, paroxysmal nocturnal dyspnea or shortness of breath Dermatological: negative for rash Respiratory: negative for cough or wheezing Urologic: negative for hematuria Abdominal: negative for nausea, vomiting, diarrhea, bright red blood per rectum, melena, or hematemesis Neurologic: negative for visual changes, syncope, or dizziness All other systems reviewed and are otherwise negative except as noted above.    Blood pressure 132/70, pulse 72, height 5\' 11"  (1.803 m), weight 202 lb 12.8 oz (92 kg), SpO2 97 %.  General appearance: alert and no distress Neck: no adenopathy, no JVD, supple, symmetrical, trachea midline, thyroid not enlarged, symmetric, no tenderness/mass/nodules, and right carotid bruit Lungs: clear to auscultation bilaterally Heart: regular rate and rhythm, S1, S2 normal, no murmur, click, rub or gallop Extremities: extremities normal, atraumatic, no cyanosis or edema Pulses: 2+ and symmetric Skin: Skin color, texture, turgor normal. No rashes or lesions Neurologic: Grossly normal  EKG not performed today  ASSESSMENT AND PLAN:   Essential hypertension History of essential hypertension a blood pressure measured today at 132/70.  He is on amlodipine, carvedilol and lisinopril.  Dyslipidemia, goal LDL below 70 History of dyslipidemia on Crestor with lipid profile performed 06/22/2021 revealing total cholesterol 145.  CAD S/P percutaneous coronary angioplasty History of CAD status post LAD PCI and drug-eluting stenting at [facility] by Dr. Lovena Neighbours May 2018.  He continues on aspirin.  He is asymptomatic.  Carotid artery disease ([place]) History of right carotid endarterectomy at [facility] in April.  Does have a carotid bruit on the  right side on exam today.  We will obtain carotid Dopplers on him in 1 year.     Lorretta Harp MD FACP,FACC,FAHA, [facility] 10/16/2021 10:41 AM

## 2021-10-16 NOTE — Assessment & Plan Note (Signed)
History of essential hypertension a blood pressure measured today at 132/70.  He is on amlodipine, carvedilol and lisinopril.

## 2021-10-16 NOTE — Patient Instructions (Signed)
Medication Instructions:  Your physician recommends that you continue on your current medications as directed. Please refer to the Current Medication list given to you today.  *If you need a refill on your cardiac medications before your next appointment, please call your pharmacy*   Testing/Procedures: Your physician has requested that you have a carotid duplex. This test is an ultrasound of the carotid arteries in your neck. It looks at blood flow through these arteries that supply the brain with blood. Allow one hour for this exam. There are no restrictions or special instructions. To do in August 2024. This procedure will be done at [address]     Follow-Up: At [facility], you and your health needs are our priority.  As part of our continuing mission to provide you with exceptional heart care, we have created designated Provider Care Teams.  These Care Teams include your primary Cardiologist (physician) and Advanced Practice Providers (APPs -  Physician Assistants and Nurse Practitioners) who all work together to provide you with the care you need, when you need it.  We recommend signing up for the patient portal called "MyChart".  Sign up information is provided on this After Visit Summary.  MyChart is used to connect with patients for Virtual Visits (Telemedicine).  Patients are able to view lab/test results, encounter notes, upcoming appointments, etc.  Non-urgent messages can be sent to your provider as well.   To learn more about what you can do with MyChart, go to [email or web address].    Your next appointment:   12 month(s)  The format for your next appointment:   In Person  Provider:   [name], MD   

## 2021-10-27 DIAGNOSIS — E1165 Type 2 diabetes mellitus with hyperglycemia: Secondary | ICD-10-CM | POA: Diagnosis not present

## 2021-10-27 DIAGNOSIS — I10 Essential (primary) hypertension: Secondary | ICD-10-CM | POA: Diagnosis not present

## 2021-10-27 DIAGNOSIS — E039 Hypothyroidism, unspecified: Secondary | ICD-10-CM | POA: Diagnosis not present

## 2021-11-08 DIAGNOSIS — J342 Deviated nasal septum: Secondary | ICD-10-CM | POA: Diagnosis not present

## 2021-11-08 DIAGNOSIS — J343 Hypertrophy of nasal turbinates: Secondary | ICD-10-CM | POA: Diagnosis not present

## 2021-11-08 DIAGNOSIS — E119 Type 2 diabetes mellitus without complications: Secondary | ICD-10-CM | POA: Diagnosis not present

## 2021-11-08 DIAGNOSIS — J3489 Other specified disorders of nose and nasal sinuses: Secondary | ICD-10-CM | POA: Diagnosis not present

## 2021-12-06 ENCOUNTER — Other Ambulatory Visit: Payer: Self-pay | Admitting: Cardiovascular Disease

## 2021-12-26 DIAGNOSIS — E1165 Type 2 diabetes mellitus with hyperglycemia: Secondary | ICD-10-CM | POA: Diagnosis not present

## 2021-12-26 DIAGNOSIS — E785 Hyperlipidemia, unspecified: Secondary | ICD-10-CM | POA: Diagnosis not present

## 2021-12-26 DIAGNOSIS — E039 Hypothyroidism, unspecified: Secondary | ICD-10-CM | POA: Diagnosis not present

## 2021-12-31 DIAGNOSIS — Z0000 Encounter for general adult medical examination without abnormal findings: Secondary | ICD-10-CM | POA: Diagnosis not present

## 2021-12-31 DIAGNOSIS — Z6827 Body mass index (BMI) 27.0-27.9, adult: Secondary | ICD-10-CM | POA: Diagnosis not present

## 2022-01-24 DIAGNOSIS — E039 Hypothyroidism, unspecified: Secondary | ICD-10-CM | POA: Diagnosis not present

## 2022-01-24 DIAGNOSIS — D510 Vitamin B12 deficiency anemia due to intrinsic factor deficiency: Secondary | ICD-10-CM | POA: Diagnosis not present

## 2022-03-12 DIAGNOSIS — G4763 Sleep related bruxism: Secondary | ICD-10-CM | POA: Diagnosis not present

## 2022-03-12 DIAGNOSIS — I10 Essential (primary) hypertension: Secondary | ICD-10-CM | POA: Diagnosis not present

## 2022-03-12 DIAGNOSIS — I2510 Atherosclerotic heart disease of native coronary artery without angina pectoris: Secondary | ICD-10-CM | POA: Diagnosis not present

## 2022-03-12 DIAGNOSIS — G912 (Idiopathic) normal pressure hydrocephalus: Secondary | ICD-10-CM | POA: Diagnosis not present

## 2022-03-12 DIAGNOSIS — I639 Cerebral infarction, unspecified: Secondary | ICD-10-CM | POA: Diagnosis not present

## 2022-03-12 DIAGNOSIS — I472 Ventricular tachycardia, unspecified: Secondary | ICD-10-CM | POA: Diagnosis not present

## 2022-03-12 DIAGNOSIS — E119 Type 2 diabetes mellitus without complications: Secondary | ICD-10-CM | POA: Diagnosis not present

## 2022-03-12 DIAGNOSIS — R0683 Snoring: Secondary | ICD-10-CM | POA: Diagnosis not present

## 2022-03-12 DIAGNOSIS — G4733 Obstructive sleep apnea (adult) (pediatric): Secondary | ICD-10-CM | POA: Diagnosis not present

## 2022-03-12 DIAGNOSIS — G4761 Periodic limb movement disorder: Secondary | ICD-10-CM | POA: Diagnosis not present

## 2022-04-16 DIAGNOSIS — G912 (Idiopathic) normal pressure hydrocephalus: Secondary | ICD-10-CM | POA: Diagnosis not present

## 2022-04-16 DIAGNOSIS — G4733 Obstructive sleep apnea (adult) (pediatric): Secondary | ICD-10-CM | POA: Diagnosis not present

## 2022-04-16 DIAGNOSIS — I639 Cerebral infarction, unspecified: Secondary | ICD-10-CM | POA: Diagnosis not present

## 2022-05-17 DIAGNOSIS — G4733 Obstructive sleep apnea (adult) (pediatric): Secondary | ICD-10-CM | POA: Diagnosis not present

## 2022-05-28 DIAGNOSIS — E039 Hypothyroidism, unspecified: Secondary | ICD-10-CM | POA: Diagnosis not present

## 2022-05-28 DIAGNOSIS — E1165 Type 2 diabetes mellitus with hyperglycemia: Secondary | ICD-10-CM | POA: Diagnosis not present

## 2022-06-06 DIAGNOSIS — G4733 Obstructive sleep apnea (adult) (pediatric): Secondary | ICD-10-CM | POA: Diagnosis not present

## 2022-06-07 DIAGNOSIS — E7800 Pure hypercholesterolemia, unspecified: Secondary | ICD-10-CM | POA: Diagnosis not present

## 2022-06-07 DIAGNOSIS — G4733 Obstructive sleep apnea (adult) (pediatric): Secondary | ICD-10-CM | POA: Diagnosis not present

## 2022-06-07 DIAGNOSIS — E039 Hypothyroidism, unspecified: Secondary | ICD-10-CM | POA: Diagnosis not present

## 2022-06-07 DIAGNOSIS — E1129 Type 2 diabetes mellitus with other diabetic kidney complication: Secondary | ICD-10-CM | POA: Diagnosis not present

## 2022-06-07 DIAGNOSIS — Z6828 Body mass index (BMI) 28.0-28.9, adult: Secondary | ICD-10-CM | POA: Diagnosis not present

## 2022-06-07 DIAGNOSIS — I10 Essential (primary) hypertension: Secondary | ICD-10-CM | POA: Diagnosis not present

## 2022-06-07 DIAGNOSIS — K219 Gastro-esophageal reflux disease without esophagitis: Secondary | ICD-10-CM | POA: Diagnosis not present

## 2022-06-14 DIAGNOSIS — J029 Acute pharyngitis, unspecified: Secondary | ICD-10-CM | POA: Diagnosis not present

## 2022-06-14 DIAGNOSIS — J324 Chronic pansinusitis: Secondary | ICD-10-CM | POA: Diagnosis not present

## 2022-06-16 DIAGNOSIS — G4733 Obstructive sleep apnea (adult) (pediatric): Secondary | ICD-10-CM | POA: Diagnosis not present

## 2022-06-26 DIAGNOSIS — G4733 Obstructive sleep apnea (adult) (pediatric): Secondary | ICD-10-CM | POA: Diagnosis not present

## 2022-09-10 IMAGING — CT CT HEAD W/O CM
4 series · 15 of 47 positions shown, 17 images · non-contrast
Comparison: 02/26/2016.

CLINICAL DATA: VP shunt, left foot dragging.

EXAM:
CT HEAD WITHOUT CONTRAST
TECHNIQUE: Contiguous axial images were obtained from the base of the skull
through the vertex without intravenous contrast.

[Series 2: head wo · axial · 0.47mm/px · z∈[+1474,+1589]mm · 7 of 31 slices shown, 9 images]
[im 4/31  brain]
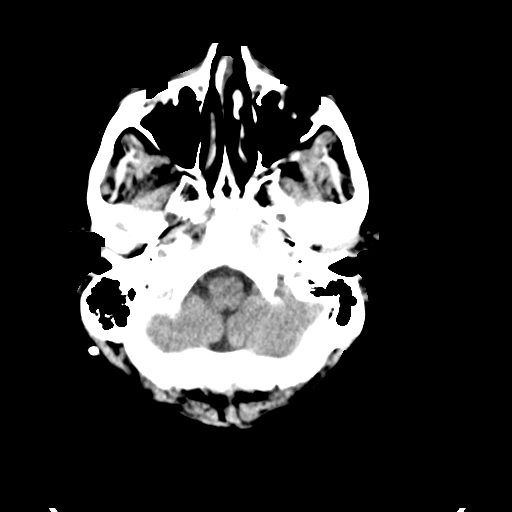
[im 4/31  bone]
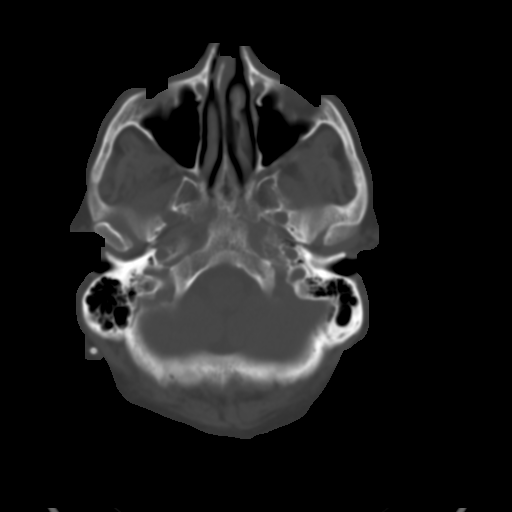
[im 8/31  brain]
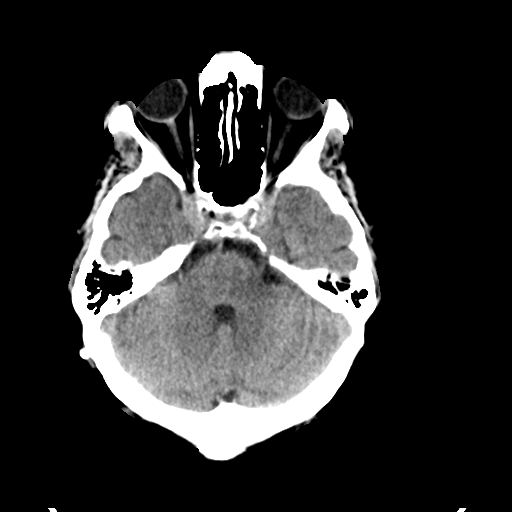
[im 12/31  brain]
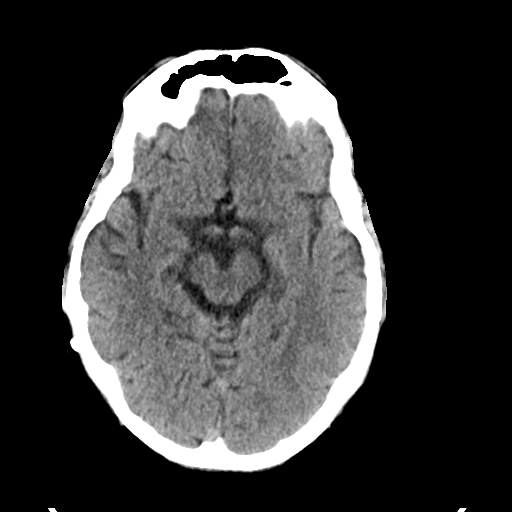
[im 16/31  brain]
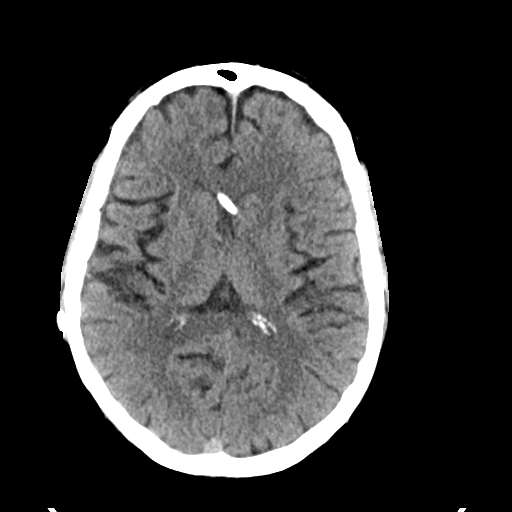
[im 19/31  brain]
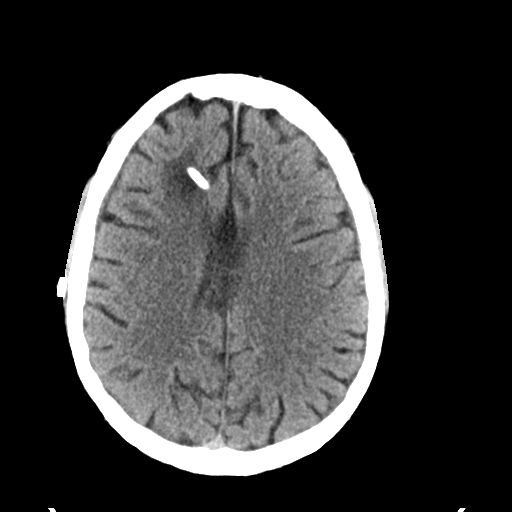
[im 19/31  bone]
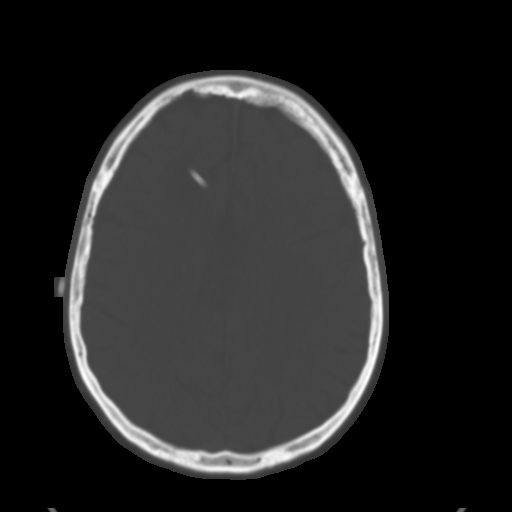
[im 23/31  brain]
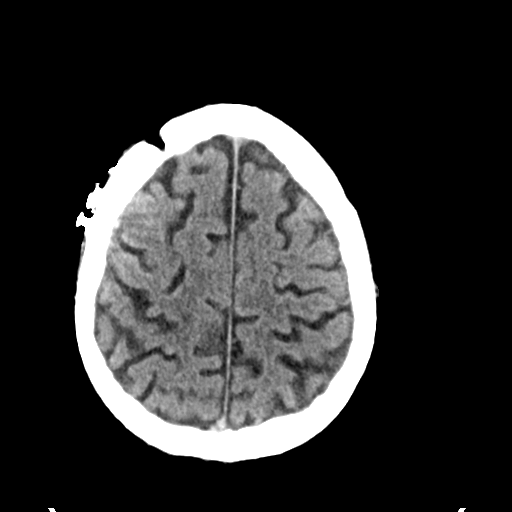
[im 27/31  brain]
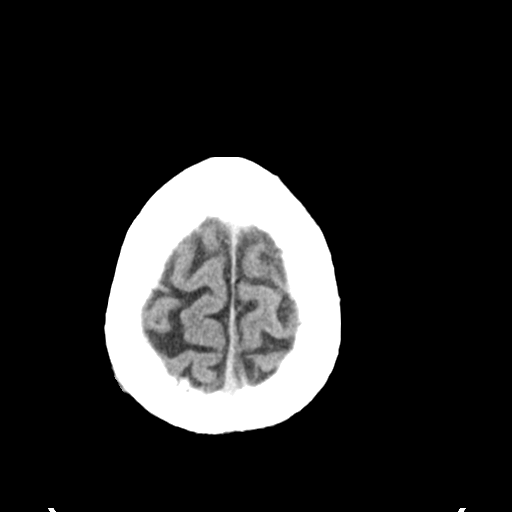

[Series 3: head bone · axial · 0.47mm/px · z∈[+1473,+1487]mm · 2 of 74 slices shown]
[im 8/74  bone]
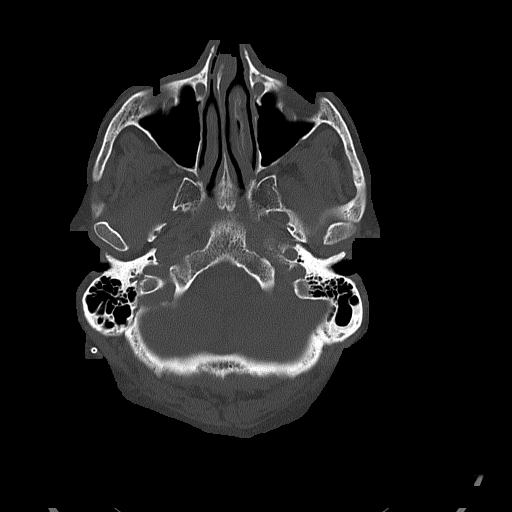
[im 15/74  bone]
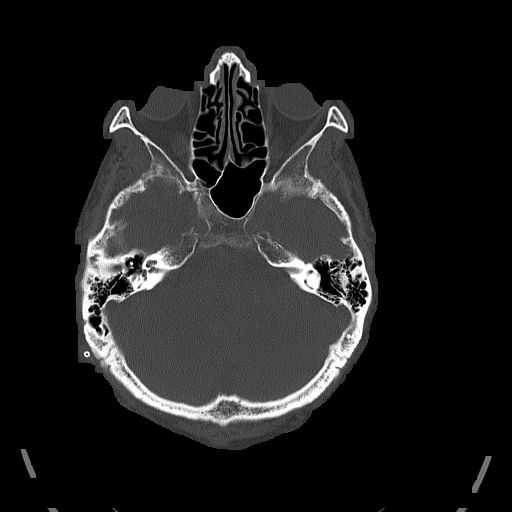

[Series 4: coronal soft tissue · coronal · 0.42mm/px · 3 of 68 slices shown]
[im 23/68  brain]
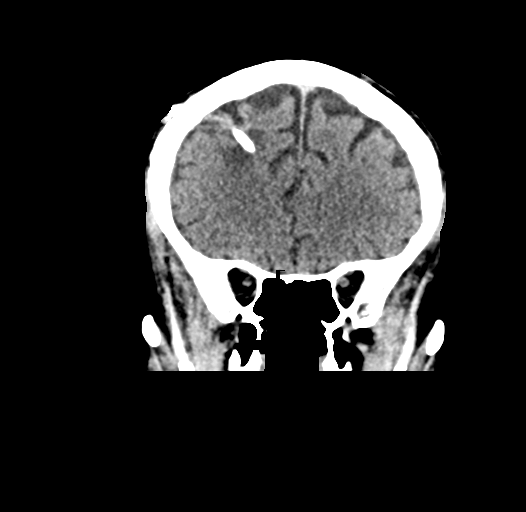
[im 30/68  brain]
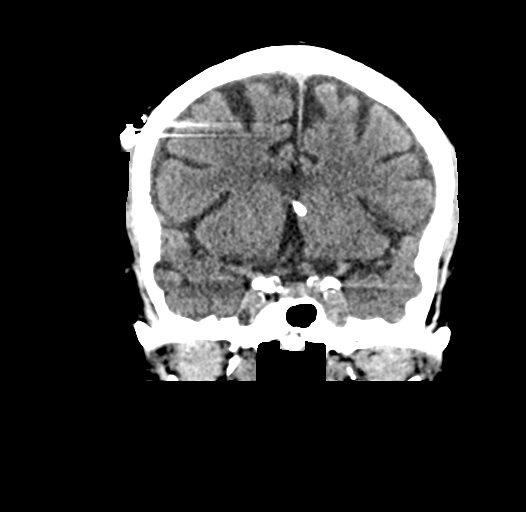
[im 38/68  brain]
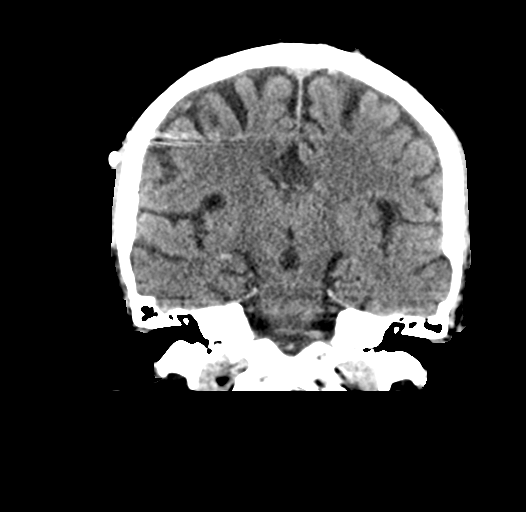

[Series 5: sagittal soft tissue · sagittal · 0.35mm/px · 3 of 53 slices shown]
[im 18/53  brain]
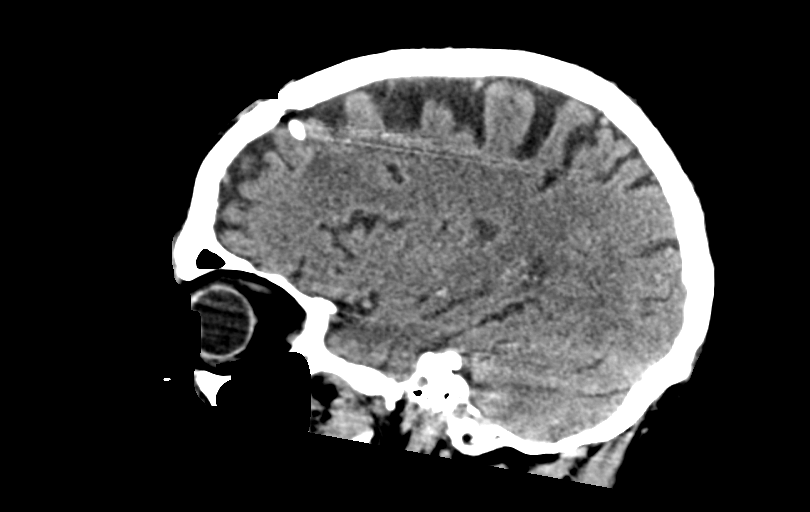
[im 27/53  brain]
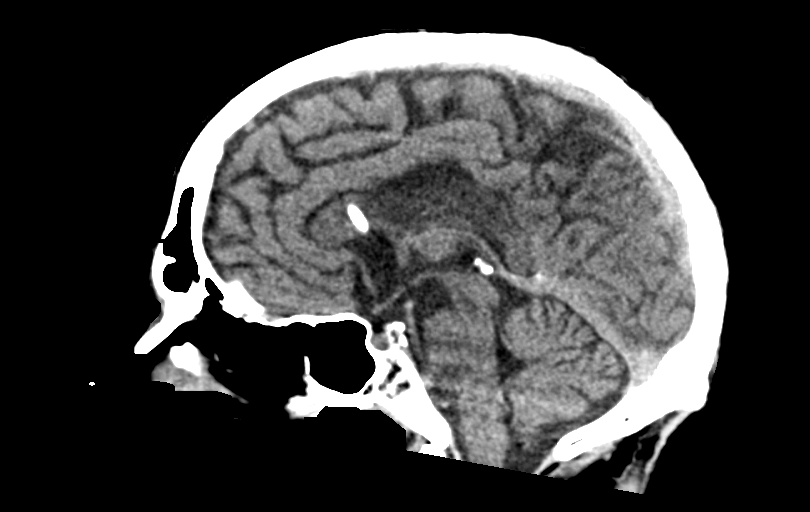
[im 35/53  brain]
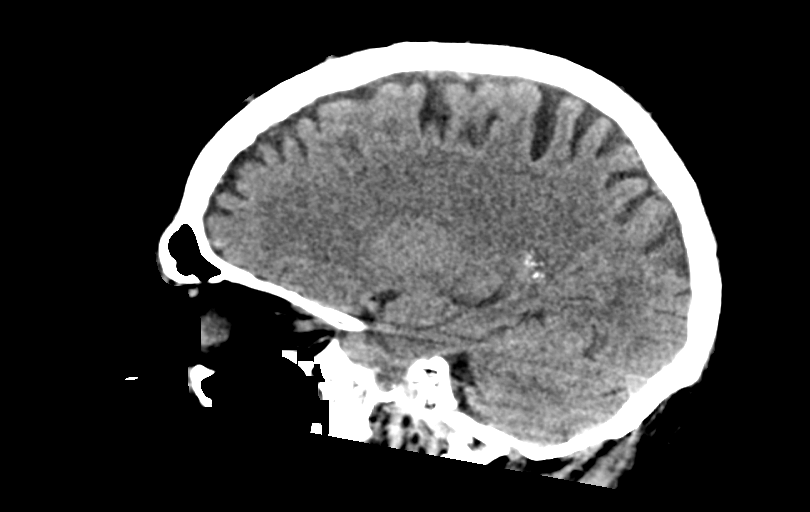

[15 of 47 positions shown; findings below may reference images not displayed]

FINDINGS: Brain: No acute intracranial hemorrhage, midline shift or mass
effect. No extra-axial fluid collection. A ventriculoperitoneal
shunt enters the calvarium from a right frontal approach and
terminates in the region of the anterior horn of the left lateral
ventricle. There is no hydrocephalus. A hypodense region is seen in
the frontal lobe on the right adjacent to the shunt which is likely
related to postsurgical changes. A hypodensity is present in the
subcortical white matter in the frontoparietal region on the right
which is indeterminate in age.

Vascular: No hyperdense vessel or unexpected calcification.

Skull: Negative for fracture.

Sinuses/Orbits: Mild mucosal thickening is present in the right
maxillary sinus. The orbits are within normal limits.

Other: None.
IMPRESSION: 1. No acute intracranial hemorrhage.
2. Right ventriculoperitoneal shunt terminating in the region of the
anterior horn of the left lateral ventricle. No hydrocephalus.
3. Subcortical hypodensity in the frontoparietal region on the right
which is new from the previous exam, possible infarct of
indeterminate age. Comparison with more recent imaging studies or
MRI is suggested for follow-up.

## 2022-10-01 DIAGNOSIS — Z982 Presence of cerebrospinal fluid drainage device: Secondary | ICD-10-CM | POA: Diagnosis not present

## 2022-10-01 DIAGNOSIS — G912 (Idiopathic) normal pressure hydrocephalus: Secondary | ICD-10-CM | POA: Diagnosis not present

## 2022-10-04 DIAGNOSIS — Z23 Encounter for immunization: Secondary | ICD-10-CM | POA: Diagnosis not present

## 2022-10-07 DIAGNOSIS — G912 (Idiopathic) normal pressure hydrocephalus: Secondary | ICD-10-CM | POA: Diagnosis not present

## 2022-10-07 DIAGNOSIS — M5430 Sciatica, unspecified side: Secondary | ICD-10-CM | POA: Diagnosis not present

## 2022-10-17 ENCOUNTER — Encounter (HOSPITAL_COMMUNITY): Payer: Medicare PPO

## 2022-10-17 DIAGNOSIS — G4733 Obstructive sleep apnea (adult) (pediatric): Secondary | ICD-10-CM | POA: Diagnosis not present

## 2022-10-23 DIAGNOSIS — I10 Essential (primary) hypertension: Secondary | ICD-10-CM | POA: Diagnosis not present

## 2022-10-23 DIAGNOSIS — E039 Hypothyroidism, unspecified: Secondary | ICD-10-CM | POA: Diagnosis not present

## 2022-10-23 DIAGNOSIS — R269 Unspecified abnormalities of gait and mobility: Secondary | ICD-10-CM | POA: Diagnosis not present

## 2022-10-23 DIAGNOSIS — K219 Gastro-esophageal reflux disease without esophagitis: Secondary | ICD-10-CM | POA: Diagnosis not present

## 2022-10-23 DIAGNOSIS — N3941 Urge incontinence: Secondary | ICD-10-CM | POA: Diagnosis not present

## 2022-10-23 DIAGNOSIS — I2510 Atherosclerotic heart disease of native coronary artery without angina pectoris: Secondary | ICD-10-CM | POA: Diagnosis not present

## 2022-10-23 DIAGNOSIS — E785 Hyperlipidemia, unspecified: Secondary | ICD-10-CM | POA: Diagnosis not present

## 2022-10-23 DIAGNOSIS — G4733 Obstructive sleep apnea (adult) (pediatric): Secondary | ICD-10-CM | POA: Diagnosis not present

## 2022-10-23 DIAGNOSIS — K5900 Constipation, unspecified: Secondary | ICD-10-CM | POA: Diagnosis not present

## 2022-10-31 DIAGNOSIS — E785 Hyperlipidemia, unspecified: Secondary | ICD-10-CM | POA: Diagnosis not present

## 2022-10-31 DIAGNOSIS — E1122 Type 2 diabetes mellitus with diabetic chronic kidney disease: Secondary | ICD-10-CM | POA: Diagnosis not present

## 2022-10-31 DIAGNOSIS — I152 Hypertension secondary to endocrine disorders: Secondary | ICD-10-CM | POA: Diagnosis not present

## 2022-10-31 DIAGNOSIS — J309 Allergic rhinitis, unspecified: Secondary | ICD-10-CM | POA: Diagnosis not present

## 2022-10-31 DIAGNOSIS — E1165 Type 2 diabetes mellitus with hyperglycemia: Secondary | ICD-10-CM | POA: Diagnosis not present

## 2022-10-31 DIAGNOSIS — E1169 Type 2 diabetes mellitus with other specified complication: Secondary | ICD-10-CM | POA: Diagnosis not present

## 2022-10-31 DIAGNOSIS — N182 Chronic kidney disease, stage 2 (mild): Secondary | ICD-10-CM | POA: Diagnosis not present

## 2022-10-31 DIAGNOSIS — E1159 Type 2 diabetes mellitus with other circulatory complications: Secondary | ICD-10-CM | POA: Diagnosis not present

## 2022-10-31 DIAGNOSIS — G912 (Idiopathic) normal pressure hydrocephalus: Secondary | ICD-10-CM | POA: Diagnosis not present

## 2022-11-01 DIAGNOSIS — R930 Abnormal findings on diagnostic imaging of skull and head, not elsewhere classified: Secondary | ICD-10-CM | POA: Diagnosis not present

## 2022-11-01 DIAGNOSIS — I6529 Occlusion and stenosis of unspecified carotid artery: Secondary | ICD-10-CM | POA: Diagnosis not present

## 2022-11-01 DIAGNOSIS — Z982 Presence of cerebrospinal fluid drainage device: Secondary | ICD-10-CM | POA: Diagnosis not present

## 2022-11-01 DIAGNOSIS — M47816 Spondylosis without myelopathy or radiculopathy, lumbar region: Secondary | ICD-10-CM | POA: Diagnosis not present

## 2022-11-01 DIAGNOSIS — M4316 Spondylolisthesis, lumbar region: Secondary | ICD-10-CM | POA: Diagnosis not present

## 2022-11-01 DIAGNOSIS — M48061 Spinal stenosis, lumbar region without neurogenic claudication: Secondary | ICD-10-CM | POA: Diagnosis not present

## 2022-11-01 DIAGNOSIS — M4726 Other spondylosis with radiculopathy, lumbar region: Secondary | ICD-10-CM | POA: Diagnosis not present

## 2022-11-01 DIAGNOSIS — R2681 Unsteadiness on feet: Secondary | ICD-10-CM | POA: Diagnosis not present

## 2022-11-01 DIAGNOSIS — Z8673 Personal history of transient ischemic attack (TIA), and cerebral infarction without residual deficits: Secondary | ICD-10-CM | POA: Diagnosis not present

## 2022-11-01 DIAGNOSIS — G912 (Idiopathic) normal pressure hydrocephalus: Secondary | ICD-10-CM | POA: Diagnosis not present

## 2022-11-12 DIAGNOSIS — G912 (Idiopathic) normal pressure hydrocephalus: Secondary | ICD-10-CM | POA: Diagnosis not present

## 2022-11-16 DIAGNOSIS — G4733 Obstructive sleep apnea (adult) (pediatric): Secondary | ICD-10-CM | POA: Diagnosis not present

## 2022-11-26 DIAGNOSIS — D510 Vitamin B12 deficiency anemia due to intrinsic factor deficiency: Secondary | ICD-10-CM | POA: Diagnosis not present

## 2022-11-26 DIAGNOSIS — D509 Iron deficiency anemia, unspecified: Secondary | ICD-10-CM | POA: Diagnosis not present

## 2022-11-26 DIAGNOSIS — E1129 Type 2 diabetes mellitus with other diabetic kidney complication: Secondary | ICD-10-CM | POA: Diagnosis not present

## 2022-11-26 DIAGNOSIS — E039 Hypothyroidism, unspecified: Secondary | ICD-10-CM | POA: Diagnosis not present

## 2022-12-09 DIAGNOSIS — G4733 Obstructive sleep apnea (adult) (pediatric): Secondary | ICD-10-CM | POA: Diagnosis not present

## 2022-12-17 DIAGNOSIS — G4733 Obstructive sleep apnea (adult) (pediatric): Secondary | ICD-10-CM | POA: Diagnosis not present

## 2022-12-18 ENCOUNTER — Other Ambulatory Visit: Payer: Self-pay | Admitting: Cardiovascular Disease

## 2023-01-06 DIAGNOSIS — M25552 Pain in left hip: Secondary | ICD-10-CM | POA: Diagnosis not present

## 2023-01-06 DIAGNOSIS — Z0000 Encounter for general adult medical examination without abnormal findings: Secondary | ICD-10-CM | POA: Diagnosis not present

## 2023-01-06 DIAGNOSIS — Z125 Encounter for screening for malignant neoplasm of prostate: Secondary | ICD-10-CM | POA: Diagnosis not present

## 2023-01-06 DIAGNOSIS — Z6826 Body mass index (BMI) 26.0-26.9, adult: Secondary | ICD-10-CM | POA: Diagnosis not present

## 2023-01-06 DIAGNOSIS — G919 Hydrocephalus, unspecified: Secondary | ICD-10-CM | POA: Diagnosis not present

## 2023-01-06 DIAGNOSIS — M25541 Pain in joints of right hand: Secondary | ICD-10-CM | POA: Diagnosis not present

## 2023-01-06 DIAGNOSIS — E1121 Type 2 diabetes mellitus with diabetic nephropathy: Secondary | ICD-10-CM | POA: Diagnosis not present

## 2023-01-15 ENCOUNTER — Other Ambulatory Visit: Payer: Self-pay | Admitting: Cardiovascular Disease

## 2023-01-16 DIAGNOSIS — G4733 Obstructive sleep apnea (adult) (pediatric): Secondary | ICD-10-CM | POA: Diagnosis not present

## 2023-01-17 DIAGNOSIS — S42032A Displaced fracture of lateral end of left clavicle, initial encounter for closed fracture: Secondary | ICD-10-CM | POA: Diagnosis not present

## 2023-01-17 DIAGNOSIS — M25512 Pain in left shoulder: Secondary | ICD-10-CM | POA: Diagnosis not present

## 2023-01-28 ENCOUNTER — Other Ambulatory Visit: Payer: Self-pay | Admitting: Cardiovascular Disease

## 2023-01-28 DIAGNOSIS — Z6827 Body mass index (BMI) 27.0-27.9, adult: Secondary | ICD-10-CM | POA: Diagnosis not present

## 2023-01-28 DIAGNOSIS — S42002A Fracture of unspecified part of left clavicle, initial encounter for closed fracture: Secondary | ICD-10-CM | POA: Diagnosis not present

## 2023-01-31 ENCOUNTER — Other Ambulatory Visit: Payer: Self-pay

## 2023-01-31 DIAGNOSIS — E785 Hyperlipidemia, unspecified: Secondary | ICD-10-CM

## 2023-01-31 MED ORDER — EZETIMIBE 10 MG PO TABS: 10.0000 mg | ORAL_TABLET | Freq: Every day | ORAL | 3 refills | Status: DC

## 2023-02-05 ENCOUNTER — Telehealth: Payer: Self-pay | Admitting: Cardiovascular Disease

## 2023-02-05 MED ORDER — METOPROLOL SUCCINATE ER 25 MG PO TB24: 25.0000 mg | ORAL_TABLET | Freq: Every day | ORAL | 0 refills | Status: DC

## 2023-02-05 NOTE — Telephone Encounter (Signed)
*  STAT* If patient is at the pharmacy, call can be transferred to refill team.   1. Which medications need to be refilled? (please list name of each medication and dose if known) metoprolol  succinate (TOPROL -XL) 25 MG 24 hr tablet   2. Which pharmacy/location (including street and city if local pharmacy) is medication to be sent to? [facility] - [place], [place] - [address] Phone: [phone]  Fax: [phone]     3. Do they need a 30 day or 90 day supply? Pt has made appt for 02/14/23 please refill til then

## 2023-02-05 NOTE — Telephone Encounter (Signed)
 Pt's medication was sent to pt's pharmacy as requested. Confirmation received.

## 2023-02-05 NOTE — Telephone Encounter (Signed)
 Please see 1/8 telephone encounter for documentation

## 2023-02-11 NOTE — Progress Notes (Signed)
Cardiology Clinic Note   Patient Name: Alvin Davis Date of Encounter: 02/14/2023  Primary Care Provider:  Noni Saupe, MD Primary Cardiologist:  Nanetta Batty, MD  Patient Profile    Alvin Davis 78 year old male presents to the clinic today for follow-up evaluation of his coronary artery disease and hyperlipidemia.  Past Medical History    Past Medical History:  Diagnosis Date   Family history of heart disease    Hyperlipidemia    Hypertension    Obesity    Type 2 diabetes mellitus (HCC)    Past Surgical History:  Procedure Laterality Date   DOBUTAMINE STRESS ECHO  07/24/1999   GXT  03/09/2009   NM MYOVIEW LTD  12/23/2001   EF 78%    Allergies  Allergies  Allergen Reactions   Chlorhexidine Itching and Rash    Patient unsure, developed rash after surgery   Ivp Dye [Iodinated Contrast Media] Rash    History of Present Illness    Alvin Davis has a PMH of coronary artery disease, carotid artery disease status post right carotid endarterectomy, essential hypertension, hyperlipidemia, non-insulin dependent type 2 diabetes, and normal pressure hydrocephalus.  He is a retired Nurse, learning disability.  He was seen and evaluated by Dr. Lorin Picket.  He was referred to cardiology for evaluation of increasing fatigue and dyspnea.  He reported a family history of a brother who had bypass surgery at age 45.  Dr.Berry performed an echocardiogram and stress testing.  Both were reassuring.  He wore a cardiac event monitor after having an episode of syncope.  Event monitor showed nonsustained ventricular tachycardia.  He was referred to neurosurgery at [facility].  He was referred to elective physiologist and underwent cardiac catheterization by Dr. Huel Coventry 5/18 which showed 80% mid LAD.  He received PCI with DES to his mid LAD lesion.  He was placed on dual antiplatelet therapy which was discontinued after development of subdural hematoma.  He was seen in  follow-up by Dr. Allyson Sabal on 10/16/2021.  During that time he continued to do well.  He had received preoperative cardiac evaluation 06/28/2021 for nasal surgery.  He also underwent an elective right carotid endarterectomy at [facility] 4/23.  He denied chest discomfort.  He continued to take aspirin daily.  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  He did have a recent fall where he broke his left clavicle.  He is healing well.  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  He occasionally feels in for the courts.  His blood pressure today is well-controlled at 110/68.  His pulse today is 80.  He does have a new right bundle branch block.  He denies exertional chest discomfort.  I will continue his current medical therapy and plan follow-up in 12 months and as needed..  Today he denies chest pain, shortness of breath, lower extremity edema, fatigue, palpitations, melena, hematuria, hemoptysis, diaphoresis, weakness, presyncope, syncope, orthopnea, and PND.      Home Medications    Prior to Admission medications   Medication Sig Start Date End Date Taking? Authorizing Provider  amLODipine (NORVASC) 10 MG tablet Take 1 tablet by mouth daily. 05/11/21   [provider]  carvedilol (COREG) 25 MG tablet Take 25 mg by mouth 2 (two) times daily. 09/10/21   [provider]  empagliflozin (JARDIANCE) 25 MG TABS tablet Take by  mouth daily.    [provider]  ezetimibe (ZETIA) 10 MG tablet Take 1 tablet (10 mg total) by mouth daily. 01/31/23   Runell Gess, MD  fluticasone (FLONASE) 50 MCG/ACT nasal spray Frequency:as needed   Dosage:50   MCG/ACT  Instructions:Fluticasone Propionate (50MCG/ACT SUSP, Nasal as needed)  Note:    [provider]  glipiZIDE (GLUCOTROL XL) 2.5 MG 24 hr tablet Take 2.5 mg by mouth daily. 09/24/21   [provider]  levothyroxine (SYNTHROID, LEVOTHROID)  125 MCG tablet 125 mcg daily. 02/21/14   [provider]  lisinopril (ZESTRIL) 40 MG tablet Take 40 mg by mouth daily. 06/14/21   [provider]  metFORMIN (GLUCOPHAGE) 1000 MG tablet 1,000 mg 2 (two) times daily. 02/07/14   [provider]  metoprolol succinate (TOPROL-XL) 25 MG 24 hr tablet Take 1 tablet (25 mg total) by mouth daily. 02/05/23   Runell Gess, MD  mirabegron ER (MYRBETRIQ) 25 MG TB24 tablet Take 1 tablet by mouth daily. 01/07/20   [provider]  omeprazole (PRILOSEC) 40 MG capsule 40 mg as needed. 08/13/13   [provider]  rosuvastatin (CRESTOR) 40 MG tablet Take 1 tablet (40 mg total) by mouth daily. 08/23/21   [facility], Roe Rutherford, PA  tadalafil (CIALIS) 5 MG tablet Take 5 mg by mouth daily. 02/24/21   [provider]  TRULICITY 0.75 MG/0.5ML SOPN Inject 1.5 mg/min into the skin once a week.  05/12/14   [provider]  zolpidem (AMBIEN) 10 MG tablet Take 1 tablet by mouth as needed. 03/16/14   [provider]    Family History    Family History  Problem Relation Age of Onset   Heart Problems Father    Heart disease Brother    He indicated that his mother is deceased. He indicated that his father is deceased. He indicated that only one of his two brothers is alive.  Social History    Social History   Socioeconomic History   Marital status: Married    Spouse name: Not on file   Number of children: Not on file   Years of education: Not on file   Highest education level: Not on file  Occupational History   Not on file  Tobacco Use   Smoking status: Never   Smokeless tobacco: Never  Substance and Sexual Activity   Alcohol use: Not on file   Drug use: Not on file   Sexual activity: Not on file  Other Topics Concern   Not on file  Social History Narrative   Not on file   Social Drivers of Health   Financial Resource Strain: Low Risk  (05/10/2021)   Received [facility], [facility]  Health Care   Overall Financial Resource Strain (CARDIA)    Difficulty of Paying Living Expenses: Not very hard  Food Insecurity: No Food Insecurity (05/10/2021)   Received from [facility], [facility]   Hunger Vital Sign    Worried About Running Out of Food in the Last Year: Never true    Ran Out of Food in the Last Year: Never true  Transportation Needs: No Transportation Needs (05/10/2021)   Received from [facility], [facility]   [facility] - Transportation    Lack of Transportation (Medical): No    Lack of Transportation (Non-Medical): No  Physical Activity: Not on file  Stress: Not on file  Social Connections: Not on file  Intimate Partner Violence: Not on  file     Review of Systems    General:  No chills, fever, night sweats or weight changes.  Cardiovascular:  No chest pain, dyspnea on exertion, edema, orthopnea, palpitations, paroxysmal nocturnal dyspnea. Dermatological: No rash, lesions/masses Respiratory: No cough, dyspnea Urologic: No hematuria, dysuria Abdominal:   No nausea, vomiting, diarrhea, bright red blood per rectum, melena, or hematemesis Neurologic:  No visual changes, wkns, changes in mental status. All other systems reviewed and are otherwise negative except as noted above.  Physical Exam    VS:  BP 110/68 (BP Location: Left Arm, Patient Position: Sitting, Cuff Size: Normal)   Pulse 80   Ht 5\' 11"  (1.803 m)   Wt 199 lb 3.2 oz (90.4 kg)   SpO2 93%   BMI 27.78 kg/m  , BMI Body mass index is 27.78 kg/m. GEN: Well nourished, well developed, in no acute distress. HEENT: normal. Neck: Supple, no JVD, carotid bruits, or masses. Cardiac: RRR, no murmurs, rubs, or gallops. No clubbing, cyanosis, edema.  Radials/DP/PT 2+ and equal bilaterally.  Respiratory:  Respirations regular and unlabored, clear to auscultation bilaterally. GI: Soft, nontender, nondistended, BS + x 4. MS: no deformity or atrophy. Skin: warm and dry, no rash. Neuro:   Strength and sensation are intact. Psych: Normal affect.  Accessory Clinical Findings    Recent Labs: No results found for requested labs within last 365 days.   Recent Lipid Panel No results found for: "CHOL", "TRIG", "HDL", "CHOLHDL", "VLDL", "LDLCALC", "LDLDIRECT"       ECG personally reviewed by me today- EKG Interpretation Date/Time:  Friday February 14 2023 08:23:36 EST Ventricular Rate:  80 PR Interval:  172 QRS Duration:  128 QT Interval:  424 QTC Calculation: 489 R Axis:   29  Text Interpretation: Normal sinus rhythm Right bundle branch block No previous ECGs available Confirmed by Edd Fabian [phone]) on 02/14/2023 8:30:07 AM    Echocardiogram 03/31/2014  LV EF: 60% -   65%   -------------------------------------------------------------------  Indications:     786.05 Dyspnea.   -------------------------------------------------------------------  History:  PMH:  Hyperlipidemia.  Risk factors:  Hypertension.  Diabetes mellitus.   -------------------------------------------------------------------  Study Conclusions   - Left ventricle: The cavity size was normal. There was moderate    concentric hypertrophy. Systolic function was normal. The    estimated ejection fraction was in the range of 60% to 65%. Wall    motion was normal; there were no regional wall motion    abnormalities. Doppler parameters are consistent with abnormal    left ventricular relaxation (grade 1 diastolic dysfunction). The    E/e&' ratio is between 8-15, suggesting indeterminate LV filling    pressure.  - Left atrium: The atrium was normal in size.   Impressions:   - LVEF 60-65%, moderate concentric LVH, normal wall motion,    diastolic dysfunction, indeterminate LV filling pressure, normal    LA size.   Transthoracic echocardiography.  M-mode, complete 2D, spectral  Doppler, and color Doppler.  Birthdate:  Patient birthdate:  04/19/1945.  Age:  Patient is 78 yr old.  Sex:  Gender:  male.  BMI: 34.2 kg/m^2.  Blood pressure:     119/96  Patient status:  Outpatient.  Study date:  Study date: 03/31/2014. Study time: 07:48  AM.  Location:  Echo laboratory.   -------------------------------------------------------------------   -------------------------------------------------------------------  Left ventricle:  The cavity size was normal. There was moderate  concentric hypertrophy. Systolic function was normal. The estimated  ejection fraction was in the range  of 60% to 65%. Wall motion was  normal; there were no regional wall motion abnormalities. Doppler  parameters are consistent with abnormal left ventricular relaxation  (grade 1 diastolic dysfunction). The E/e&' ratio is between 8-15,  suggesting indeterminate LV filling pressure.   -------------------------------------------------------------------  Aortic valve:   Structurally normal valve. Trileaflet. Cusp  separation was normal.  Doppler:  Transvalvular velocity was within  the normal range. There was no stenosis. There was no  regurgitation.   -------------------------------------------------------------------  Aorta: Aortic root: The aortic root was normal in size.  Ascending aorta: The ascending aorta was normal in size.   -------------------------------------------------------------------  Mitral valve:   Structurally normal valve.   Leaflet separation was  normal.  Doppler:  Transvalvular velocity was within the normal  range. There was no evidence for stenosis. There was trivial  regurgitation.   -------------------------------------------------------------------  Left atrium:  The atrium was normal in size.   -------------------------------------------------------------------  Atrial septum:  No defect or patent foramen ovale was identified.    -------------------------------------------------------------------  Right ventricle:  The cavity size was normal. Wall thickness was  normal. Systolic  function was normal.   -------------------------------------------------------------------  Pulmonic valve:    The valve appears to be grossly normal.  Doppler:  There was trivial regurgitation.   -------------------------------------------------------------------  Tricuspid valve:   Doppler:  There was no significant  regurgitation.   -------------------------------------------------------------------  Pulmonary artery:   The main pulmonary artery was normal-sized.   -------------------------------------------------------------------  Right atrium:  The atrium was normal in size.   -------------------------------------------------------------------  Pericardium: There was no pericardial effusion.   -------------------------------------------------------------------  Systemic veins:  Inferior vena cava: The vessel was normal in size. The  respirophasic diameter changes were in the normal range (= 50%),  consistent with normal central venous pressure. Diameter: 20 mm.       Assessment & Plan   1.  Coronary artery disease-no chest pain today.  Denies anginal symptoms.  Underwent cardiac catheterization with PCI and DES to his LAD by Dr. Huel Coventry 5/18.  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 Continue current medical therapy Heart healthy low-sodium diet Increase physical activity as tolerated  Hyperlipidemia- compliant with statin therapy. High-fiber diet Continue rosuvastatin, zetia Follows with pcp  Carotid artery stenosis-denies episodes of lightheadedness, presyncope and syncope.  Underwent right carotid endarterectomy at [facility] 4/23. Order bilateral carotid ultrasound Continue aspirin, rosuvastatin  Essential hypertension-BP today 110/68. Maintain blood pressure log Continue amlodipine, carvedilol, lisinopril Heart healthy low-sodium diet  Right bundle branch block-heart rate today 80 bpm.  Denies lightheadedness, presyncope or  syncope. Continue to monitor  Disposition: Follow-up with Dr.Berry or me in 12 months.   Thomasene Ripple. Braydan Marriott NP-C     02/14/2023, 8:47 AM [facility] [address] Office [phone] Fax [phone]    I spent 14 minutes examining this patient, reviewing medications, and using patient centered shared decision making involving their cardiac care.   I spent greater than 20 minutes reviewing their past medical history,  medications, and prior cardiac tests.

## 2023-02-13 DIAGNOSIS — S42002A Fracture of unspecified part of left clavicle, initial encounter for closed fracture: Secondary | ICD-10-CM | POA: Diagnosis not present

## 2023-02-13 DIAGNOSIS — Z6827 Body mass index (BMI) 27.0-27.9, adult: Secondary | ICD-10-CM | POA: Diagnosis not present

## 2023-02-14 ENCOUNTER — Encounter: Payer: Self-pay | Admitting: General Practice

## 2023-02-14 ENCOUNTER — Ambulatory Visit: Payer: Medicare PPO | Attending: General Practice | Admitting: General Practice

## 2023-02-14 VITALS — BP 110/68 | HR 80 | Ht 71.0 in | Wt 199.2 lb

## 2023-02-14 DIAGNOSIS — I10 Essential (primary) hypertension: Secondary | ICD-10-CM

## 2023-02-14 DIAGNOSIS — E785 Hyperlipidemia, unspecified: Secondary | ICD-10-CM | POA: Diagnosis not present

## 2023-02-14 DIAGNOSIS — I6521 Occlusion and stenosis of right carotid artery: Secondary | ICD-10-CM | POA: Diagnosis not present

## 2023-02-14 DIAGNOSIS — I4510 Unspecified right bundle-branch block: Secondary | ICD-10-CM | POA: Diagnosis not present

## 2023-02-14 DIAGNOSIS — I2510 Atherosclerotic heart disease of native coronary artery without angina pectoris: Secondary | ICD-10-CM | POA: Diagnosis not present

## 2023-02-14 DIAGNOSIS — Z9861 Coronary angioplasty status: Secondary | ICD-10-CM

## 2023-02-14 NOTE — Patient Instructions (Signed)
Medication Instructions:  The current medical regimen is effective;  continue present plan and medications as directed. Please refer to the Current Medication list given to you today.  *If you need a refill on your cardiac medications before your next appointment, please call your pharmacy*  Lab Work: NONE  Testing/Procedures: NONE  Other Instructions PLEASE READ AND FOLLOW ATTACHED  SALTY 6   Follow-Up: At [facility], you and your health needs are our priority.  As part of our continuing mission to provide you with exceptional heart care, we have created designated Provider Care Teams.  These Care Teams include your primary Cardiologist (physician) and Advanced Practice Providers (APPs -  Physician Assistants and Nurse Practitioners) who all work together to provide you with the care you need, when you need it.  Your next appointment:   12 month(s)  Provider:   Nanetta Batty, MD

## 2023-02-16 DIAGNOSIS — G4733 Obstructive sleep apnea (adult) (pediatric): Secondary | ICD-10-CM | POA: Diagnosis not present

## 2023-03-05 ENCOUNTER — Other Ambulatory Visit: Payer: Self-pay | Admitting: Cardiovascular Disease

## 2023-03-12 DIAGNOSIS — Z6826 Body mass index (BMI) 26.0-26.9, adult: Secondary | ICD-10-CM | POA: Diagnosis not present

## 2023-03-12 DIAGNOSIS — S42002A Fracture of unspecified part of left clavicle, initial encounter for closed fracture: Secondary | ICD-10-CM | POA: Diagnosis not present

## 2023-03-19 DIAGNOSIS — G4733 Obstructive sleep apnea (adult) (pediatric): Secondary | ICD-10-CM | POA: Diagnosis not present

## 2023-04-08 DIAGNOSIS — Z6826 Body mass index (BMI) 26.0-26.9, adult: Secondary | ICD-10-CM | POA: Diagnosis not present

## 2023-04-08 DIAGNOSIS — R2681 Unsteadiness on feet: Secondary | ICD-10-CM | POA: Diagnosis not present

## 2023-04-08 DIAGNOSIS — L989 Disorder of the skin and subcutaneous tissue, unspecified: Secondary | ICD-10-CM | POA: Diagnosis not present

## 2023-04-08 DIAGNOSIS — S42002A Fracture of unspecified part of left clavicle, initial encounter for closed fracture: Secondary | ICD-10-CM | POA: Diagnosis not present

## 2023-04-08 DIAGNOSIS — R531 Weakness: Secondary | ICD-10-CM | POA: Diagnosis not present

## 2023-04-16 DIAGNOSIS — G4733 Obstructive sleep apnea (adult) (pediatric): Secondary | ICD-10-CM | POA: Diagnosis not present

## 2023-04-17 DIAGNOSIS — R269 Unspecified abnormalities of gait and mobility: Secondary | ICD-10-CM | POA: Diagnosis not present

## 2023-04-17 DIAGNOSIS — R293 Abnormal posture: Secondary | ICD-10-CM | POA: Diagnosis not present

## 2023-04-17 DIAGNOSIS — R2681 Unsteadiness on feet: Secondary | ICD-10-CM | POA: Diagnosis not present

## 2023-04-17 DIAGNOSIS — M2569 Stiffness of other specified joint, not elsewhere classified: Secondary | ICD-10-CM | POA: Diagnosis not present

## 2023-04-17 DIAGNOSIS — M6281 Muscle weakness (generalized): Secondary | ICD-10-CM | POA: Diagnosis not present

## 2023-04-23 DIAGNOSIS — R293 Abnormal posture: Secondary | ICD-10-CM | POA: Diagnosis not present

## 2023-04-23 DIAGNOSIS — M6281 Muscle weakness (generalized): Secondary | ICD-10-CM | POA: Diagnosis not present

## 2023-04-23 DIAGNOSIS — M2569 Stiffness of other specified joint, not elsewhere classified: Secondary | ICD-10-CM | POA: Diagnosis not present

## 2023-04-23 DIAGNOSIS — R2681 Unsteadiness on feet: Secondary | ICD-10-CM | POA: Diagnosis not present

## 2023-04-23 DIAGNOSIS — R269 Unspecified abnormalities of gait and mobility: Secondary | ICD-10-CM | POA: Diagnosis not present

## 2023-04-28 ENCOUNTER — Other Ambulatory Visit: Payer: Self-pay

## 2023-04-28 DIAGNOSIS — I2510 Atherosclerotic heart disease of native coronary artery without angina pectoris: Secondary | ICD-10-CM

## 2023-04-28 DIAGNOSIS — Z9861 Coronary angioplasty status: Secondary | ICD-10-CM | POA: Diagnosis not present

## 2023-04-28 DIAGNOSIS — E785 Hyperlipidemia, unspecified: Secondary | ICD-10-CM

## 2023-04-28 DIAGNOSIS — M2569 Stiffness of other specified joint, not elsewhere classified: Secondary | ICD-10-CM | POA: Diagnosis not present

## 2023-04-28 DIAGNOSIS — M6281 Muscle weakness (generalized): Secondary | ICD-10-CM | POA: Diagnosis not present

## 2023-04-28 DIAGNOSIS — R2681 Unsteadiness on feet: Secondary | ICD-10-CM | POA: Diagnosis not present

## 2023-04-28 DIAGNOSIS — R293 Abnormal posture: Secondary | ICD-10-CM | POA: Diagnosis not present

## 2023-04-28 DIAGNOSIS — R269 Unspecified abnormalities of gait and mobility: Secondary | ICD-10-CM | POA: Diagnosis not present

## 2023-04-29 LAB — LIPID PANEL
Chol/HDL Ratio: 2.6 ratio (ref 0.0–5.0)
Cholesterol, Total: 116 mg/dL (ref 100–199)
HDL: 44 mg/dL (ref >39)
LDL Chol Calc (NIH): 53 mg/dL (ref 0–99)
Triglycerides: 100 mg/dL (ref 0–149)
VLDL Cholesterol Cal: 19 mg/dL (ref 5–40)

## 2023-04-29 LAB — HEPATIC FUNCTION PANEL
ALT: 28 IU/L (ref 0–44)
AST: 21 IU/L (ref 0–40)
Albumin: 4.2 g/dL (ref 3.8–4.8)
Alkaline Phosphatase: 63 IU/L (ref 44–121)
Bilirubin Total: 0.5 mg/dL (ref 0.0–1.2)
Bilirubin, Direct: 0.2 mg/dL (ref 0.00–0.40)
Total Protein: 6.7 g/dL (ref 6.0–8.5)

## 2023-05-05 DIAGNOSIS — R293 Abnormal posture: Secondary | ICD-10-CM | POA: Diagnosis not present

## 2023-05-05 DIAGNOSIS — R2681 Unsteadiness on feet: Secondary | ICD-10-CM | POA: Diagnosis not present

## 2023-05-05 DIAGNOSIS — M6281 Muscle weakness (generalized): Secondary | ICD-10-CM | POA: Diagnosis not present

## 2023-05-05 DIAGNOSIS — M2569 Stiffness of other specified joint, not elsewhere classified: Secondary | ICD-10-CM | POA: Diagnosis not present

## 2023-05-05 DIAGNOSIS — R269 Unspecified abnormalities of gait and mobility: Secondary | ICD-10-CM | POA: Diagnosis not present

## 2023-05-08 DIAGNOSIS — R2681 Unsteadiness on feet: Secondary | ICD-10-CM | POA: Diagnosis not present

## 2023-05-08 DIAGNOSIS — R269 Unspecified abnormalities of gait and mobility: Secondary | ICD-10-CM | POA: Diagnosis not present

## 2023-05-08 DIAGNOSIS — R293 Abnormal posture: Secondary | ICD-10-CM | POA: Diagnosis not present

## 2023-05-08 DIAGNOSIS — M6281 Muscle weakness (generalized): Secondary | ICD-10-CM | POA: Diagnosis not present

## 2023-05-08 DIAGNOSIS — M2569 Stiffness of other specified joint, not elsewhere classified: Secondary | ICD-10-CM | POA: Diagnosis not present

## 2023-05-15 DIAGNOSIS — M2569 Stiffness of other specified joint, not elsewhere classified: Secondary | ICD-10-CM | POA: Diagnosis not present

## 2023-05-15 DIAGNOSIS — R2681 Unsteadiness on feet: Secondary | ICD-10-CM | POA: Diagnosis not present

## 2023-05-15 DIAGNOSIS — M6281 Muscle weakness (generalized): Secondary | ICD-10-CM | POA: Diagnosis not present

## 2023-05-15 DIAGNOSIS — R293 Abnormal posture: Secondary | ICD-10-CM | POA: Diagnosis not present

## 2023-05-15 DIAGNOSIS — R269 Unspecified abnormalities of gait and mobility: Secondary | ICD-10-CM | POA: Diagnosis not present

## 2023-05-23 DIAGNOSIS — R269 Unspecified abnormalities of gait and mobility: Secondary | ICD-10-CM | POA: Diagnosis not present

## 2023-05-23 DIAGNOSIS — R2681 Unsteadiness on feet: Secondary | ICD-10-CM | POA: Diagnosis not present

## 2023-05-23 DIAGNOSIS — R293 Abnormal posture: Secondary | ICD-10-CM | POA: Diagnosis not present

## 2023-05-23 DIAGNOSIS — M6281 Muscle weakness (generalized): Secondary | ICD-10-CM | POA: Diagnosis not present

## 2023-05-23 DIAGNOSIS — M2569 Stiffness of other specified joint, not elsewhere classified: Secondary | ICD-10-CM | POA: Diagnosis not present

## 2023-05-27 DIAGNOSIS — R293 Abnormal posture: Secondary | ICD-10-CM | POA: Diagnosis not present

## 2023-05-27 DIAGNOSIS — R2681 Unsteadiness on feet: Secondary | ICD-10-CM | POA: Diagnosis not present

## 2023-05-27 DIAGNOSIS — R269 Unspecified abnormalities of gait and mobility: Secondary | ICD-10-CM | POA: Diagnosis not present

## 2023-05-27 DIAGNOSIS — M6281 Muscle weakness (generalized): Secondary | ICD-10-CM | POA: Diagnosis not present

## 2023-05-27 DIAGNOSIS — M2569 Stiffness of other specified joint, not elsewhere classified: Secondary | ICD-10-CM | POA: Diagnosis not present

## 2023-05-29 DIAGNOSIS — M6281 Muscle weakness (generalized): Secondary | ICD-10-CM | POA: Diagnosis not present

## 2023-05-29 DIAGNOSIS — M2569 Stiffness of other specified joint, not elsewhere classified: Secondary | ICD-10-CM | POA: Diagnosis not present

## 2023-05-29 DIAGNOSIS — R269 Unspecified abnormalities of gait and mobility: Secondary | ICD-10-CM | POA: Diagnosis not present

## 2023-05-29 DIAGNOSIS — R2681 Unsteadiness on feet: Secondary | ICD-10-CM | POA: Diagnosis not present

## 2023-05-29 DIAGNOSIS — R293 Abnormal posture: Secondary | ICD-10-CM | POA: Diagnosis not present

## 2023-06-04 DIAGNOSIS — R293 Abnormal posture: Secondary | ICD-10-CM | POA: Diagnosis not present

## 2023-06-04 DIAGNOSIS — R269 Unspecified abnormalities of gait and mobility: Secondary | ICD-10-CM | POA: Diagnosis not present

## 2023-06-04 DIAGNOSIS — M6281 Muscle weakness (generalized): Secondary | ICD-10-CM | POA: Diagnosis not present

## 2023-06-04 DIAGNOSIS — R2681 Unsteadiness on feet: Secondary | ICD-10-CM | POA: Diagnosis not present

## 2023-06-04 DIAGNOSIS — M2569 Stiffness of other specified joint, not elsewhere classified: Secondary | ICD-10-CM | POA: Diagnosis not present

## 2023-06-10 DIAGNOSIS — R2681 Unsteadiness on feet: Secondary | ICD-10-CM | POA: Diagnosis not present

## 2023-06-10 DIAGNOSIS — R293 Abnormal posture: Secondary | ICD-10-CM | POA: Diagnosis not present

## 2023-06-10 DIAGNOSIS — M2569 Stiffness of other specified joint, not elsewhere classified: Secondary | ICD-10-CM | POA: Diagnosis not present

## 2023-06-10 DIAGNOSIS — M6281 Muscle weakness (generalized): Secondary | ICD-10-CM | POA: Diagnosis not present

## 2023-06-10 DIAGNOSIS — R269 Unspecified abnormalities of gait and mobility: Secondary | ICD-10-CM | POA: Diagnosis not present

## 2023-07-04 DIAGNOSIS — E039 Hypothyroidism, unspecified: Secondary | ICD-10-CM | POA: Diagnosis not present

## 2023-07-04 DIAGNOSIS — E1129 Type 2 diabetes mellitus with other diabetic kidney complication: Secondary | ICD-10-CM | POA: Diagnosis not present

## 2023-07-09 DIAGNOSIS — E1159 Type 2 diabetes mellitus with other circulatory complications: Secondary | ICD-10-CM | POA: Diagnosis not present

## 2023-07-09 DIAGNOSIS — Z6826 Body mass index (BMI) 26.0-26.9, adult: Secondary | ICD-10-CM | POA: Diagnosis not present

## 2023-07-09 DIAGNOSIS — E1169 Type 2 diabetes mellitus with other specified complication: Secondary | ICD-10-CM | POA: Diagnosis not present

## 2023-07-09 DIAGNOSIS — G912 (Idiopathic) normal pressure hydrocephalus: Secondary | ICD-10-CM | POA: Diagnosis not present

## 2023-07-09 DIAGNOSIS — E1121 Type 2 diabetes mellitus with diabetic nephropathy: Secondary | ICD-10-CM | POA: Diagnosis not present

## 2023-07-09 DIAGNOSIS — E039 Hypothyroidism, unspecified: Secondary | ICD-10-CM | POA: Diagnosis not present

## 2023-07-09 DIAGNOSIS — E785 Hyperlipidemia, unspecified: Secondary | ICD-10-CM | POA: Diagnosis not present

## 2023-07-09 DIAGNOSIS — I152 Hypertension secondary to endocrine disorders: Secondary | ICD-10-CM | POA: Diagnosis not present

## 2023-07-15 DIAGNOSIS — G4733 Obstructive sleep apnea (adult) (pediatric): Secondary | ICD-10-CM | POA: Diagnosis not present

## 2023-07-15 DIAGNOSIS — Z982 Presence of cerebrospinal fluid drainage device: Secondary | ICD-10-CM | POA: Diagnosis not present

## 2023-07-15 DIAGNOSIS — G912 (Idiopathic) normal pressure hydrocephalus: Secondary | ICD-10-CM | POA: Diagnosis not present

## 2023-07-15 DIAGNOSIS — I639 Cerebral infarction, unspecified: Secondary | ICD-10-CM | POA: Diagnosis not present

## 2023-07-23 DIAGNOSIS — H524 Presbyopia: Secondary | ICD-10-CM | POA: Diagnosis not present

## 2023-07-23 DIAGNOSIS — E119 Type 2 diabetes mellitus without complications: Secondary | ICD-10-CM | POA: Diagnosis not present

## 2023-09-01 DIAGNOSIS — L820 Inflamed seborrheic keratosis: Secondary | ICD-10-CM | POA: Diagnosis not present

## 2023-09-01 DIAGNOSIS — L570 Actinic keratosis: Secondary | ICD-10-CM | POA: Diagnosis not present

## 2023-09-01 DIAGNOSIS — D225 Melanocytic nevi of trunk: Secondary | ICD-10-CM | POA: Diagnosis not present

## 2023-09-01 DIAGNOSIS — L300 Nummular dermatitis: Secondary | ICD-10-CM | POA: Diagnosis not present

## 2023-09-01 DIAGNOSIS — L578 Other skin changes due to chronic exposure to nonionizing radiation: Secondary | ICD-10-CM | POA: Diagnosis not present

## 2023-10-06 DIAGNOSIS — E039 Hypothyroidism, unspecified: Secondary | ICD-10-CM | POA: Diagnosis not present

## 2023-10-06 DIAGNOSIS — E1129 Type 2 diabetes mellitus with other diabetic kidney complication: Secondary | ICD-10-CM | POA: Diagnosis not present

## 2023-10-09 DIAGNOSIS — N182 Chronic kidney disease, stage 2 (mild): Secondary | ICD-10-CM | POA: Diagnosis not present

## 2023-10-09 DIAGNOSIS — E039 Hypothyroidism, unspecified: Secondary | ICD-10-CM | POA: Diagnosis not present

## 2023-10-09 DIAGNOSIS — G912 (Idiopathic) normal pressure hydrocephalus: Secondary | ICD-10-CM | POA: Diagnosis not present

## 2023-10-09 DIAGNOSIS — I472 Ventricular tachycardia, unspecified: Secondary | ICD-10-CM | POA: Diagnosis not present

## 2023-10-09 DIAGNOSIS — E1122 Type 2 diabetes mellitus with diabetic chronic kidney disease: Secondary | ICD-10-CM | POA: Diagnosis not present

## 2023-10-09 DIAGNOSIS — R413 Other amnesia: Secondary | ICD-10-CM | POA: Diagnosis not present

## 2023-10-09 DIAGNOSIS — E049 Nontoxic goiter, unspecified: Secondary | ICD-10-CM | POA: Diagnosis not present

## 2023-10-09 DIAGNOSIS — Z6826 Body mass index (BMI) 26.0-26.9, adult: Secondary | ICD-10-CM | POA: Diagnosis not present

## 2023-10-09 DIAGNOSIS — E1121 Type 2 diabetes mellitus with diabetic nephropathy: Secondary | ICD-10-CM | POA: Diagnosis not present

## 2023-10-17 DIAGNOSIS — E049 Nontoxic goiter, unspecified: Secondary | ICD-10-CM | POA: Diagnosis not present

## 2023-11-11 DIAGNOSIS — G912 (Idiopathic) normal pressure hydrocephalus: Secondary | ICD-10-CM | POA: Diagnosis not present

## 2023-11-11 DIAGNOSIS — I472 Ventricular tachycardia, unspecified: Secondary | ICD-10-CM | POA: Diagnosis not present

## 2023-11-11 DIAGNOSIS — M1990 Unspecified osteoarthritis, unspecified site: Secondary | ICD-10-CM | POA: Diagnosis not present

## 2023-11-11 DIAGNOSIS — E1151 Type 2 diabetes mellitus with diabetic peripheral angiopathy without gangrene: Secondary | ICD-10-CM | POA: Diagnosis not present

## 2023-11-11 DIAGNOSIS — E785 Hyperlipidemia, unspecified: Secondary | ICD-10-CM | POA: Diagnosis not present

## 2023-11-11 DIAGNOSIS — E1122 Type 2 diabetes mellitus with diabetic chronic kidney disease: Secondary | ICD-10-CM | POA: Diagnosis not present

## 2023-11-11 DIAGNOSIS — Z7982 Long term (current) use of aspirin: Secondary | ICD-10-CM | POA: Diagnosis not present

## 2023-11-11 DIAGNOSIS — E039 Hypothyroidism, unspecified: Secondary | ICD-10-CM | POA: Diagnosis not present

## 2023-11-11 DIAGNOSIS — E1136 Type 2 diabetes mellitus with diabetic cataract: Secondary | ICD-10-CM | POA: Diagnosis not present

## 2023-11-20 DIAGNOSIS — G4733 Obstructive sleep apnea (adult) (pediatric): Secondary | ICD-10-CM | POA: Diagnosis not present

## 2024-02-03 ENCOUNTER — Other Ambulatory Visit: Payer: Self-pay | Admitting: Cardiovascular Disease

## 2024-02-04 ENCOUNTER — Encounter: Payer: Self-pay | Admitting: Cardiovascular Disease
